# Patient Record
Sex: Male | Born: 1995 | Race: Black or African American | Hispanic: No | Marital: Single | Smoking: Current some day smoker
Health system: Southern US, Community
[De-identification: ages and names within clinical notes are randomized; demographics above are authoritative.]

## PROBLEM LIST (undated history)

## (undated) DIAGNOSIS — K859 Acute pancreatitis without necrosis or infection, unspecified: Secondary | ICD-10-CM

## (undated) DIAGNOSIS — F319 Bipolar disorder, unspecified: Secondary | ICD-10-CM

## (undated) DIAGNOSIS — E109 Type 1 diabetes mellitus without complications: Secondary | ICD-10-CM

## (undated) DIAGNOSIS — F329 Major depressive disorder, single episode, unspecified: Secondary | ICD-10-CM

## (undated) DIAGNOSIS — Q339 Congenital malformation of lung, unspecified: Secondary | ICD-10-CM

## (undated) DIAGNOSIS — J45909 Unspecified asthma, uncomplicated: Secondary | ICD-10-CM

## (undated) DIAGNOSIS — F32A Depression, unspecified: Secondary | ICD-10-CM

## (undated) DIAGNOSIS — E119 Type 2 diabetes mellitus without complications: Secondary | ICD-10-CM

## (undated) DIAGNOSIS — J45998 Other asthma: Secondary | ICD-10-CM

## (undated) HISTORY — DX: Type 2 diabetes mellitus without complications: E11.9

## (undated) HISTORY — DX: Other asthma: J45.998

## (undated) HISTORY — PX: OTHER SURGICAL HISTORY: SHX169

---
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
## 2016-01-08 ENCOUNTER — Encounter (HOSPITAL_COMMUNITY): Payer: Self-pay | Admitting: Emergency Medicine

## 2016-01-08 ENCOUNTER — Emergency Department (HOSPITAL_COMMUNITY)
Admission: EM | Admit: 2016-01-08 | Discharge: 2016-01-08 | Disposition: A | Discharge status: Home / Self Care | Attending: Emergency Medicine | Admitting: Emergency Medicine

## 2016-01-08 DIAGNOSIS — Z794 Long term (current) use of insulin: Secondary | ICD-10-CM | POA: Insufficient documentation

## 2016-01-08 DIAGNOSIS — J45909 Unspecified asthma, uncomplicated: Secondary | ICD-10-CM | POA: Diagnosis not present

## 2016-01-08 DIAGNOSIS — E1065 Type 1 diabetes mellitus with hyperglycemia: Secondary | ICD-10-CM | POA: Insufficient documentation

## 2016-01-08 DIAGNOSIS — F319 Bipolar disorder, unspecified: Secondary | ICD-10-CM | POA: Diagnosis not present

## 2016-01-08 DIAGNOSIS — R739 Hyperglycemia, unspecified: Secondary | ICD-10-CM

## 2016-01-08 DIAGNOSIS — F17210 Nicotine dependence, cigarettes, uncomplicated: Secondary | ICD-10-CM | POA: Insufficient documentation

## 2016-01-08 LAB — CBC WITH DIFFERENTIAL/PLATELET
Basophils Absolute: 0.1 10*3/uL (ref 0.0–0.1)
Basophils Relative: 2 %
Eosinophils Absolute: 0.1 10*3/uL (ref 0.0–0.7)
Eosinophils Relative: 1 %
HCT: 42 % (ref 39.0–52.0)
Hemoglobin: 14.6 g/dL (ref 13.0–17.0)
Lymphocytes Relative: 35 %
Lymphs Abs: 2.1 10*3/uL (ref 0.7–4.0)
MCH: 27 pg (ref 26.0–34.0)
MCHC: 34.8 g/dL (ref 30.0–36.0)
MCV: 77.6 fL — ABNORMAL LOW (ref 78.0–100.0)
Monocytes Absolute: 0.3 10*3/uL (ref 0.1–1.0)
Monocytes Relative: 6 %
Neutro Abs: 3.4 10*3/uL (ref 1.7–7.7)
Neutrophils Relative %: 56 %
Platelets: 255 10*3/uL (ref 150–400)
RBC: 5.41 MIL/uL (ref 4.22–5.81)
RDW: 13.1 % (ref 11.5–15.5)
WBC: 6 10*3/uL (ref 4.0–10.5)

## 2016-01-08 LAB — BASIC METABOLIC PANEL
Anion gap: 19 — ABNORMAL HIGH (ref 5–15)
BUN: 22 mg/dL — ABNORMAL HIGH (ref 6–20)
CO2: 18 mmol/L — ABNORMAL LOW (ref 22–32)
Calcium: 9.6 mg/dL (ref 8.9–10.3)
Chloride: 96 mmol/L — ABNORMAL LOW (ref 101–111)
Creatinine, Ser: 1.18 mg/dL (ref 0.61–1.24)
GFR calc Af Amer: >60 mL/min (ref >60)
GFR calc non Af Amer: >60 mL/min (ref >60)
Glucose, Bld: 505 mg/dL (ref 65–99)
Potassium: 4.6 mmol/L (ref 3.5–5.1)
Sodium: 133 mmol/L — ABNORMAL LOW (ref 135–145)

## 2016-01-08 LAB — BLOOD GAS, VENOUS
Acid-base deficit: 7.9 mmol/L — ABNORMAL HIGH (ref 0.0–2.0)
Bicarbonate: 18.8 mEq/L — ABNORMAL LOW (ref 20.0–24.0)
O2 Saturation: 80.2 %
Patient temperature: 98.6
TCO2: 17.2 mmol/L (ref 0–100)
pCO2, Ven: 44.4 mmHg — ABNORMAL LOW (ref 45.0–50.0)
pH, Ven: 7.25 (ref 7.250–7.300)
pO2, Ven: 54.8 mmHg — ABNORMAL HIGH (ref 31.0–45.0)

## 2016-01-08 LAB — CBG MONITORING, ED
GLUCOSE-CAPILLARY: 449 mg/dL — AB (ref 65–99)
Glucose-Capillary: 480 mg/dL — ABNORMAL HIGH (ref 65–99)
Glucose-Capillary: 473 mg/dL — ABNORMAL HIGH (ref 65–99)
Glucose-Capillary: 264 mg/dL — ABNORMAL HIGH (ref 65–99)

## 2016-01-08 LAB — I-STAT CHEM 8, ED
BUN: 22 mg/dL — ABNORMAL HIGH (ref 6–20)
CHLORIDE: 100 mmol/L — AB (ref 101–111)
Calcium, Ion: 1.18 mmol/L (ref 1.13–1.30)
Creatinine, Ser: 0.9 mg/dL (ref 0.61–1.24)
Glucose, Bld: 509 mg/dL (ref 65–99)
HEMATOCRIT: 48 % (ref 39.0–52.0)
Hemoglobin: 16.3 g/dL (ref 13.0–17.0)
POTASSIUM: 4.6 mmol/L (ref 3.5–5.1)
SODIUM: 133 mmol/L — AB (ref 135–145)
TCO2: 20 mmol/L (ref 0–100)

## 2016-01-08 MED ORDER — SODIUM CHLORIDE 0.9 % IV BOLUS (SEPSIS)
1000.0000 mL | Freq: Once | INTRAVENOUS | Status: AC
  Administered 2016-01-08: 1000 mL via INTRAVENOUS

## 2016-01-08 MED ORDER — SODIUM CHLORIDE 0.9 % IV SOLN
INTRAVENOUS | Status: DC
  Administered 2016-01-08: 4.1 [IU]/h via INTRAVENOUS
  Filled 2016-01-08: qty 2.5

## 2016-01-08 MED ORDER — SODIUM CHLORIDE 0.9 % IV SOLN
INTRAVENOUS | Status: DC
  Administered 2016-01-08: 09:00:00 via INTRAVENOUS

## 2016-01-08 MED ORDER — INSULIN GLARGINE 100 UNIT/ML ~~LOC~~ SOLN
38.0000 [IU] | Freq: Every day | SUBCUTANEOUS | Status: DC
  Administered 2016-01-08: 38 [IU] via SUBCUTANEOUS
  Filled 2016-01-08: qty 0.38

## 2016-01-08 NOTE — ED Notes (Signed)
CBG 473

## 2016-01-08 NOTE — Progress Notes (Addendum)
ED CM consulted for medication assistance for this pt ED CM has seen this pt before Refer to CM notes for this pt in June 2017 Pt was given resources at that time to assist him CM spoke with pt previous legal guardian during June 2017 and pt refuses to no longer use his familial support system and is now possibly homeless per legal guardian During pt last admission Unit Cm assisted pt to get an appt with [facility] at [phone], scheduled a f/u appt for Friday 12/26/15. On December 19 2015 BH d/c pt was informed to  Follow-up Information   Follow [facility] with [facility].  Why: Walk-in clinic Monday-Friday at 8am for assessment for therapy and medication management services. Please bring insurance card and ID.   Contact information:  [facility] [address] [place], [place] [address] Phone: [phone] Fax: [phone]    Follow up with [facility].  Why: CSW made a referral for follow-up appointments on 6/12. CSW will call you on 6/13 with appointment dates/times.  Contact information:  [address] [place] [address] [phone]  Pt has been noncompliant with follow ups  Pt with [facility] 6 ED visits and 5 admissions   Pt has [employer] insurance, therefore there is no [facility] medication assistance program for this pt   ED CM discussed this with EDP Roselyn BeringJ Knapp

## 2016-01-08 NOTE — Progress Notes (Addendum)
ED CM spoke with pt and his "Girlfriend", "Fiance" ? Cowanda" about medications, pcps, insurance CM discussed Pt [facility] ED visits x 6 and admissions x 5 Cm discussed concern that pt is not managing his DM and non compliance with appts and tx since d/c Initially when CM entered pt room the girlfriend had her headset on and was sitting in the bedside chair rocking to and forth with the blanket over her head CM has been asked by ED housekeeping staff to offer her a box of kleenex because she was crying. CM offered her the kleenex and she thanked CM and said she was ok but covered head again with the blanket  Cm spoke with pt who confirms he is covered by his sister [employer]. Sister is in [employer] and [place] but had been in [place] prior to pt coming to [place] Pt confirms he does not have transportation and did not go to last appt because of this.  States he lives with girlfriend, her baby at his girlfriend's mother home within zip code "[phone]" although [place] [id number]  Pt informed CM that his Lantus costs him "seven hundred dollars" and "two hundred fifty dollars" at [facility] or [facility] on [address]"  CM confirmed that goodrx cost of Lantus and Novolog is below $300 and discussed this with the pt CM provided copies of goodrx cost sheets for novolog and lantus Cm discussed with [facility] that [facility] not have a medication program to assist him with his medications nor the co pays Cm discussed patient assistance programs and discount services  CM encouraged finding a pcp within zip code [phone] or [phone] that he could walk to after getting an appointment  CM discussed and provided written information to assist pt with www.needymeds.org, www.goodrx.com, discounted pharmacies and discussed the importance of pcp vs EDP services for f/u care Reviewed resources for [facility] [place] uninsured accepting pcps like [facility], [facility] [address], [facility], [facility],  local urgent care centers, [facility], [facility], general medical clinics, [facility] services of the [facility], [facility] urgent care plus others, medication resources, [facility] out patient pharmacies and housing Pt voiced understanding.  The girlfriend became active in the conversation at this point and informed CM pt had taken last of insulin on "yesterday" and had not been eating or drinking "right" She informed pt to listen to CM and stated that she had encouraged the pt to get a job and to get new insurance States she had attempted to help pt by going to tricare website and getting him to "sign up for medicaid"  She states she likes the idea of getting a pcp near them they could walk to since this is their primary means of transportation since the mother's car is broken Cm answered questions and discussed in network and out of network tricare services and cost of going out of network CM reviewed the resource with her and encouraged her to encourage the pt to find a pcp, take his medications She stated she would encouraged pt but had issues of her own related to taking care of her child, and getting new SSN, birth certificates for her and child and attempting to find housing   ED Cm used teach back method to check if pt understood the interventions he needs to complete Pt was able to discuss the need to get a pcp, check tricare, check goodrx, check needymeds and work on a job and transportation

## 2016-01-08 NOTE — ED Notes (Signed)
Pt given bus pass ?

## 2016-01-08 NOTE — ED Notes (Signed)
Per EMS, pt felt weak and nauseous, hx of DM I, seen frequently for hyperglycemia. Patient told EMS his co-pays for insulin are too high and that he can't afford it.   18 G LFA started by EMS, bolus given d/t CBG reading "High"

## 2016-01-08 NOTE — ED Notes (Signed)
Bed: WA10 Expected date:  Expected time:  Means of arrival:  Comments: EMS-hyperglycemia 

## 2016-01-08 NOTE — ED Notes (Signed)
Pt refuses protocol O2 via [place]

## 2016-01-08 NOTE — Discharge Instructions (Signed)
Hyperglycemia °Hyperglycemia occurs when the glucose (sugar) in your blood is too high. Hyperglycemia can happen for many reasons, but it most often happens to people who do not know they have diabetes or are not managing their diabetes properly.  °CAUSES  °Whether you have diabetes or not, there are other causes of hyperglycemia. Hyperglycemia can occur when you have diabetes, but it can also occur in other situations that you might not be as aware of, such as: °Diabetes °· If you have diabetes and are having problems controlling your blood glucose, hyperglycemia could occur because of some of the following reasons: °¨ Not following your meal plan. °¨ Not taking your diabetes medications or not taking it properly. °¨ Exercising less or doing less activity than you normally do. °¨ Being sick. °Pre-diabetes °· This cannot be ignored. Before people develop Type 2 diabetes, they almost always have "pre-diabetes." This is when your blood glucose levels are higher than normal, but not yet high enough to be diagnosed as diabetes. Research has shown that some long-term damage to the body, especially the heart and circulatory system, may already be occurring during pre-diabetes. If you take action to manage your blood glucose when you have pre-diabetes, you may delay or prevent Type 2 diabetes from developing. °Stress °· If you have diabetes, you may be "diet" controlled or on oral medications or insulin to control your diabetes. However, you may find that your blood glucose is higher than usual in the hospital whether you have diabetes or not. This is often referred to as "stress hyperglycemia." Stress can elevate your blood glucose. This happens because of hormones put out by the body during times of stress. If stress has been the cause of your high blood glucose, it can be followed regularly by your caregiver. That way he/she can make sure your hyperglycemia does not continue to get worse or progress to  diabetes. °Steroids °· Steroids are medications that act on the infection fighting system (immune system) to block inflammation or infection. One side effect can be a rise in blood glucose. Most people can produce enough extra insulin to allow for this rise, but for those who cannot, steroids make blood glucose levels go even higher. It is not unusual for steroid treatments to "uncover" diabetes that is developing. It is not always possible to determine if the hyperglycemia will go away after the steroids are stopped. A special blood test called an A1c is sometimes done to determine if your blood glucose was elevated before the steroids were started. °SYMPTOMS °· Thirsty. °· Frequent urination. °· Dry mouth. °· Blurred vision. °· Tired or fatigue. °· Weakness. °· Sleepy. °· Tingling in feet or leg. °DIAGNOSIS  °Diagnosis is made by monitoring blood glucose in one or all of the following ways: °· A1c test. This is a chemical found in your blood. °· Fingerstick blood glucose monitoring. °· Laboratory results. °TREATMENT  °First, knowing the cause of the hyperglycemia is important before the hyperglycemia can be treated. Treatment may include, but is not be limited to: °· Education. °· Change or adjustment in medications. °· Change or adjustment in meal plan. °· Treatment for an illness, infection, etc. °· More frequent blood glucose monitoring. °· Change in exercise plan. °· Decreasing or stopping steroids. °· Lifestyle changes. °HOME CARE INSTRUCTIONS  °· Test your blood glucose as directed. °· Exercise regularly. Your caregiver will give you instructions about exercise. Pre-diabetes or diabetes which comes on with stress is helped by exercising. °· Eat wholesome,   balanced meals. Eat often and at regular, fixed times. Your caregiver or nutritionist will give you a meal plan to guide your sugar intake. °· Being at an ideal weight is important. If needed, losing as little as 10 to 15 pounds may help improve blood  glucose levels. °SEEK MEDICAL CARE IF:  °· You have questions about medicine, activity, or diet. °· You continue to have symptoms (problems such as increased thirst, urination, or weight gain). °SEEK IMMEDIATE MEDICAL CARE IF:  °· You are vomiting or have diarrhea. °· Your breath smells fruity. °· You are breathing faster or slower. °· You are very sleepy or incoherent. °· You have numbness, tingling, or pain in your feet or hands. °· You have chest pain. °· Your symptoms get worse even though you have been following your caregiver's orders. °· If you have any other questions or concerns. °  °This information is not intended to replace advice given to you by your health care provider. Make sure you discuss any questions you have with your health care provider. °  °Document Released: 12/15/2000 Document Revised: 09/13/2011 Document Reviewed: 02/25/2015 °Elsevier Interactive Patient Education ©2016 [employer]. ° °

## 2016-01-08 NOTE — ED Provider Notes (Signed)
CSN: [id number]     Arrival date & time 01/08/16  0808 History   First MD Initiated Contact with Patient 01/08/16 [phone]     Chief Complaint  Patient presents with  . Hyperglycemia   HPI Patient presents to the emergency room with complaints of nausea vomiting and hyperglycemia. Has a history of diabetes. He is supposed to be on insulin. Patient has history of frequent episodes of hyperglycemia and visits to the emergency room.  The patient was recently in the hospital the end of June for similar episode.  Patient states he ran out of his insulin 2 days ago. He can't afford the co-pays which are too high. He started having trouble with his blood sugar elevating. He had one episode of nausea and vomiting today. Has general malaise. He called EMS and they recorded his CBG reading as high. He denies any trouble with fevers. No chest pain. No abdominal pain. Past Medical History  Diagnosis Date  . Pancreatitis 2013    Hospitalized in [facility] in [place], [place]  . Congenital anomaly of lung   . Asthma     no problems in 2 year  . Type I diabetes mellitus (HCC)     diagnosed at age 64-10 years  . Depression   . Depression   . Bipolar 1 disorder [facility])    Past Surgical History  Procedure Laterality Date  . Left lobectomy     Family History  Problem Relation Age of Onset  . Fibromyalgia Mother   . Mental illness Mother   . Diabetes Father     Type II, no insulin dependent.  . Birth defects Sister     genetic protein deficiency  . Heart murmur Brother    Social History  Substance Use Topics  . Smoking status: Light Tobacco Smoker -- 0.50 packs/day for 6 years    Types: Cigarettes  . Smokeless tobacco: Never Used     Comment: No smokers in the home.  . Alcohol Use: No     Comment: Patient admitted to alcohol use when at home with mother in [place]. "6 drinks weekly"    Review of Systems  All other systems reviewed and are negative.     Allergies   Benadryl; Cephalosporins; and Gantrisin  Home Medications   Prior to Admission medications   Medication Sig Start Date End Date Taking? Authorizing Provider  blood glucose meter kit and supplies KIT Dispense based on patient and insurance preference. Use up to four times daily as directed. (FOR ICD-9 250.00, 250.01). 12/24/15  Yes Zannie Cove, MD  insulin aspart (NOVOLOG) 100 UNIT/ML injection Inject 0-100 Units into the skin 3 (three) times daily with meals. Sliding scale 12/24/15  Yes Zannie Cove, MD  insulin glargine (LANTUS) 100 UNIT/ML injection Inject 0.38 mLs (38 Units total) into the skin daily. 12/24/15  Yes Zannie Cove, MD  potassium chloride SA (K-DUR,KLOR-CON) 20 MEQ tablet Take 1 tablet (20 mEq total) by mouth daily. Patient not taking: Reported on 12/23/2015 12/18/15   Joni Reining Pisciotta, PA-C   BP 133/79 mmHg  Pulse 126  Temp(Src) 98 F (36.7 C) (Oral)  Resp 16  SpO2 98% Physical Exam  Constitutional: He appears well-developed and well-nourished. No distress.  HENT:  Head: Normocephalic and atraumatic.  Right Ear: External ear normal.  Left Ear: External ear normal.  Eyes: Conjunctivae are normal. Right eye exhibits no discharge. Left eye exhibits no discharge. No scleral icterus.  Neck: Neck supple. No tracheal deviation  present.  Cardiovascular: Normal rate, regular rhythm and intact distal pulses.   Pulmonary/Chest: Effort normal and breath sounds normal. No stridor. No respiratory distress. He has no wheezes. He has no rales.  Abdominal: Soft. Bowel sounds are normal. He exhibits no distension. There is no tenderness. There is no rebound and no guarding.  Musculoskeletal: He exhibits no edema or tenderness.  Neurological: He is alert. He has normal strength. No cranial nerve deficit (no facial droop, extraocular movements intact, no slurred speech) or sensory deficit. He exhibits normal muscle tone. He displays no seizure activity. Coordination normal.  Skin:  Skin is warm and dry. No rash noted.  Psychiatric: He has a normal mood and affect.  Nursing note and vitals reviewed.   ED Course  Procedures (including critical care time) Labs Review Labs Reviewed  BLOOD GAS, VENOUS - Abnormal; Notable for the following:    pCO2, Ven 44.4 (*)    pO2, Ven 54.8 (*)    Bicarbonate 18.8 (*)    Acid-base deficit 7.9 (*)    All other components within normal limits  CBC WITH DIFFERENTIAL/PLATELET - Abnormal; Notable for the following:    MCV 77.6 (*)    All other components within normal limits  BASIC METABOLIC PANEL - Abnormal; Notable for the following:    Sodium 133 (*)    Chloride 96 (*)    CO2 18 (*)    Glucose, Bld 505 (*)    BUN 22 (*)    Anion gap 19 (*)    All other components within normal limits  CBG MONITORING, ED - Abnormal; Notable for the following:    Glucose-Capillary 480 (*)    All other components within normal limits  I-STAT CHEM 8, ED - Abnormal; Notable for the following:    Sodium 133 (*)    Chloride 100 (*)    BUN 22 (*)    Glucose, Bld 509 (*)    All other components within normal limits  CBG MONITORING, ED - Abnormal; Notable for the following:    Glucose-Capillary 473 (*)    All other components within normal limits  CBG MONITORING, ED - Abnormal; Notable for the following:    Glucose-Capillary 449 (*)    All other components within normal limits  CBG MONITORING, ED - Abnormal; Notable for the following:    Glucose-Capillary 264 (*)    All other components within normal limits     MDM   Final diagnoses:  Hyperglycemia   Blood sugar decreased with treatment.  No DKA.  At 1400 pt is feeling better and ready to go.  I reviewed notes from the patient's recent hospitalization. Patient told the diabetes care manager when he was in the hospital that he had no trouble affording his medications he just needed prescriptions. Today the patient states that his co-pays were 100s of dollars.  Discussed with case  manager, Maudie Mercury.  She had a long discussion with the patient about resources for him, getting his medications etc.  Pt states he has his rx.  I will give a printout of the Lantus coupon savings program.     Dorie Rank, MD 01/08/16 1406

## 2016-02-03 ENCOUNTER — Emergency Department (HOSPITAL_COMMUNITY)
Admission: EM | Admit: 2016-02-03 | Discharge: 2016-02-04 | Disposition: A | Discharge status: Home / Self Care | Payer: No Typology Code available for payment source | Attending: Dermatology | Admitting: Dermatology

## 2016-02-03 ENCOUNTER — Encounter (HOSPITAL_COMMUNITY): Payer: Self-pay | Admitting: Emergency Medicine

## 2016-02-03 DIAGNOSIS — R51 Headache: Secondary | ICD-10-CM | POA: Insufficient documentation

## 2016-02-03 DIAGNOSIS — Y999 Unspecified external cause status: Secondary | ICD-10-CM | POA: Insufficient documentation

## 2016-02-03 DIAGNOSIS — Y9389 Activity, other specified: Secondary | ICD-10-CM | POA: Insufficient documentation

## 2016-02-03 DIAGNOSIS — Y92410 Unspecified street and highway as the place of occurrence of the external cause: Secondary | ICD-10-CM | POA: Insufficient documentation

## 2016-02-03 DIAGNOSIS — Z5321 Procedure and treatment not carried out due to patient leaving prior to being seen by health care provider: Secondary | ICD-10-CM | POA: Insufficient documentation
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
## 2016-02-03 NOTE — ED Notes (Signed)
Pt named called, no answer 

## 2016-04-12 ENCOUNTER — Inpatient Hospital Stay (HOSPITAL_COMMUNITY)
Admission: EM | Admit: 2016-04-12 | Discharge: 2016-04-14 | DRG: 639 | Disposition: A | Discharge status: Home / Self Care | Payer: Medicaid Other | Attending: Family Medicine | Admitting: Family Medicine

## 2016-04-12 ENCOUNTER — Encounter (HOSPITAL_COMMUNITY): Payer: Self-pay | Admitting: Emergency Medicine

## 2016-04-12 DIAGNOSIS — E109 Type 1 diabetes mellitus without complications: Secondary | ICD-10-CM | POA: Diagnosis present

## 2016-04-12 DIAGNOSIS — F17210 Nicotine dependence, cigarettes, uncomplicated: Secondary | ICD-10-CM | POA: Diagnosis present

## 2016-04-12 DIAGNOSIS — F319 Bipolar disorder, unspecified: Secondary | ICD-10-CM | POA: Diagnosis present

## 2016-04-12 DIAGNOSIS — E1010 Type 1 diabetes mellitus with ketoacidosis without coma: Principal | ICD-10-CM | POA: Diagnosis present

## 2016-04-12 DIAGNOSIS — E1110 Type 2 diabetes mellitus with ketoacidosis without coma: Secondary | ICD-10-CM | POA: Diagnosis present

## 2016-04-12 DIAGNOSIS — Z882 Allergy status to sulfonamides status: Secondary | ICD-10-CM

## 2016-04-12 DIAGNOSIS — E875 Hyperkalemia: Secondary | ICD-10-CM | POA: Diagnosis present

## 2016-04-12 DIAGNOSIS — Z888 Allergy status to other drugs, medicaments and biological substances status: Secondary | ICD-10-CM

## 2016-04-12 DIAGNOSIS — Z794 Long term (current) use of insulin: Secondary | ICD-10-CM

## 2016-04-12 DIAGNOSIS — Z818 Family history of other mental and behavioral disorders: Secondary | ICD-10-CM

## 2016-04-12 DIAGNOSIS — E1065 Type 1 diabetes mellitus with hyperglycemia: Secondary | ICD-10-CM

## 2016-04-12 DIAGNOSIS — Z833 Family history of diabetes mellitus: Secondary | ICD-10-CM

## 2016-04-12 DIAGNOSIS — Z590 Homelessness: Secondary | ICD-10-CM

## 2016-04-12 LAB — URINALYSIS, ROUTINE W REFLEX MICROSCOPIC
Bilirubin Urine: NEGATIVE
Glucose, UA: >1000 mg/dL — AB
Hgb urine dipstick: NEGATIVE
Ketones, ur: >80 mg/dL — AB
Leukocytes, UA: NEGATIVE
Nitrite: NEGATIVE
Protein, ur: NEGATIVE mg/dL
Specific Gravity, Urine: 1.034 — ABNORMAL HIGH (ref 1.005–1.030)
pH: 5.5 (ref 5.0–8.0)

## 2016-04-12 LAB — GLUCOSE, CAPILLARY
GLUCOSE-CAPILLARY: 164 mg/dL — AB (ref 65–99)
GLUCOSE-CAPILLARY: 94 mg/dL (ref 65–99)
GLUCOSE-CAPILLARY: 117 mg/dL — AB (ref 65–99)
Glucose-Capillary: 284 mg/dL — ABNORMAL HIGH (ref 65–99)
Glucose-Capillary: 182 mg/dL — ABNORMAL HIGH (ref 65–99)
Glucose-Capillary: 148 mg/dL — ABNORMAL HIGH (ref 65–99)
Glucose-Capillary: 110 mg/dL — ABNORMAL HIGH (ref 65–99)

## 2016-04-12 LAB — BASIC METABOLIC PANEL WITH GFR
Anion gap: 18 — ABNORMAL HIGH (ref 5–15)
BUN: 14 mg/dL (ref 6–20)
CO2: 13 mmol/L — ABNORMAL LOW (ref 22–32)
Calcium: 9.2 mg/dL (ref 8.9–10.3)
Chloride: 99 mmol/L — ABNORMAL LOW (ref 101–111)
Creatinine, Ser: 1.18 mg/dL (ref 0.61–1.24)
GFR calc Af Amer: >60 mL/min
GFR calc non Af Amer: >60 mL/min
Glucose, Bld: 531 mg/dL (ref 65–99)
Potassium: 5.2 mmol/L — ABNORMAL HIGH (ref 3.5–5.1)
Sodium: 130 mmol/L — ABNORMAL LOW (ref 135–145)

## 2016-04-12 LAB — BASIC METABOLIC PANEL
Anion gap: 15 (ref 5–15)
Anion gap: 9 (ref 5–15)
BUN: 15 mg/dL (ref 6–20)
BUN: 11 mg/dL (ref 6–20)
CHLORIDE: 104 mmol/L (ref 101–111)
CHLORIDE: 107 mmol/L (ref 101–111)
CO2: 14 mmol/L — AB (ref 22–32)
CO2: 21 mmol/L — AB (ref 22–32)
CREATININE: 1.15 mg/dL (ref 0.61–1.24)
CREATININE: 0.94 mg/dL (ref 0.61–1.24)
Calcium: 9.3 mg/dL (ref 8.9–10.3)
Calcium: 9.3 mg/dL (ref 8.9–10.3)
GFR calc Af Amer: >60 mL/min (ref >60)
GFR calc Af Amer: >60 mL/min (ref >60)
GFR calc non Af Amer: >60 mL/min (ref >60)
GFR calc non Af Amer: >60 mL/min (ref >60)
GLUCOSE: 315 mg/dL — AB (ref 65–99)
GLUCOSE: 112 mg/dL — AB (ref 65–99)
POTASSIUM: 4.6 mmol/L (ref 3.5–5.1)
POTASSIUM: 3.9 mmol/L (ref 3.5–5.1)
SODIUM: 137 mmol/L (ref 135–145)
Sodium: 133 mmol/L — ABNORMAL LOW (ref 135–145)

## 2016-04-12 LAB — CBC WITH DIFFERENTIAL/PLATELET
Basophils Absolute: 0.1 K/uL (ref 0.0–0.1)
Basophils Relative: 1 %
Eosinophils Absolute: 0.1 K/uL (ref 0.0–0.7)
Eosinophils Relative: 1 %
HCT: 39.1 % (ref 39.0–52.0)
Hemoglobin: 13.2 g/dL (ref 13.0–17.0)
Lymphocytes Relative: 21 %
Lymphs Abs: 1.5 K/uL (ref 0.7–4.0)
MCH: 25.6 pg — ABNORMAL LOW (ref 26.0–34.0)
MCHC: 33.8 g/dL (ref 30.0–36.0)
MCV: 75.9 fL — ABNORMAL LOW (ref 78.0–100.0)
Monocytes Absolute: 0.4 K/uL (ref 0.1–1.0)
Monocytes Relative: 5 %
Neutro Abs: 5.1 K/uL (ref 1.7–7.7)
Neutrophils Relative %: 72 %
Platelets: 244 K/uL (ref 150–400)
RBC: 5.15 MIL/uL (ref 4.22–5.81)
RDW: 12.7 % (ref 11.5–15.5)
WBC: 7.1 K/uL (ref 4.0–10.5)

## 2016-04-12 LAB — CBC
HEMATOCRIT: 41.5 % (ref 39.0–52.0)
Hemoglobin: 13.7 g/dL (ref 13.0–17.0)
MCH: 25.8 pg — AB (ref 26.0–34.0)
MCHC: 33 g/dL (ref 30.0–36.0)
MCV: 78.3 fL (ref 78.0–100.0)
Platelets: 217 10*3/uL (ref 150–400)
RBC: 5.3 MIL/uL (ref 4.22–5.81)
RDW: 12.6 % (ref 11.5–15.5)
WBC: 9.3 10*3/uL (ref 4.0–10.5)

## 2016-04-12 LAB — URINE MICROSCOPIC-ADD ON

## 2016-04-12 LAB — CBG MONITORING, ED
GLUCOSE-CAPILLARY: 560 mg/dL — AB (ref 65–99)
Glucose-Capillary: 463 mg/dL — ABNORMAL HIGH (ref 65–99)
Glucose-Capillary: 338 mg/dL — ABNORMAL HIGH (ref 65–99)

## 2016-04-12 LAB — PHOSPHORUS: Phosphorus: 4.3 mg/dL (ref 2.5–4.6)

## 2016-04-12 LAB — MRSA PCR SCREENING: MRSA by PCR: NEGATIVE

## 2016-04-12 LAB — MAGNESIUM: Magnesium: 2 mg/dL (ref 1.7–2.4)

## 2016-04-12 MED ORDER — INSULIN ASPART 100 UNIT/ML ~~LOC~~ SOLN
0.0000 [IU] | Freq: Every day | SUBCUTANEOUS | Status: DC
  Administered 2016-04-13: 4 [IU] via SUBCUTANEOUS

## 2016-04-12 MED ORDER — INSULIN GLARGINE 100 UNIT/ML ~~LOC~~ SOLN: 28.0000 [IU] | Freq: Every day | SUBCUTANEOUS | Status: DC

## 2016-04-12 MED ORDER — DEXTROSE-NACL 5-0.45 % IV SOLN
INTRAVENOUS | Status: DC
  Administered 2016-04-12: 18:00:00 via INTRAVENOUS

## 2016-04-12 MED ORDER — SODIUM CHLORIDE 0.9 % IV SOLN
INTRAVENOUS | Status: AC
  Administered 2016-04-13: 08:00:00 via INTRAVENOUS

## 2016-04-12 MED ORDER — DEXTROSE-NACL 5-0.45 % IV SOLN: INTRAVENOUS | Status: DC

## 2016-04-12 MED ORDER — SODIUM CHLORIDE 0.9 % IV SOLN
INTRAVENOUS | Status: DC
  Administered 2016-04-12: 15:00:00 via INTRAVENOUS
  Filled 2016-04-12: qty 2.5

## 2016-04-12 MED ORDER — DEXTROSE 50 % IV SOLN: 25.0000 mL | INTRAVENOUS | Status: DC | PRN

## 2016-04-12 MED ORDER — SODIUM CHLORIDE 0.9 % IV SOLN
INTRAVENOUS | Status: DC
  Administered 2016-04-12: 15:00:00 via INTRAVENOUS

## 2016-04-12 MED ORDER — SODIUM CHLORIDE 0.9 % IV SOLN
INTRAVENOUS | Status: DC
  Filled 2016-04-12: qty 2.5

## 2016-04-12 MED ORDER — ENOXAPARIN SODIUM 40 MG/0.4ML ~~LOC~~ SOLN: 40.0000 mg | SUBCUTANEOUS | Status: DC

## 2016-04-12 MED ORDER — SODIUM CHLORIDE 0.9 % IV BOLUS (SEPSIS)
2000.0000 mL | Freq: Once | INTRAVENOUS | Status: AC
  Administered 2016-04-12: 2000 mL via INTRAVENOUS

## 2016-04-12 MED ORDER — INSULIN ASPART 100 UNIT/ML ~~LOC~~ SOLN: 0.0000 [IU] | Freq: Three times a day (TID) | SUBCUTANEOUS | Status: DC

## 2016-04-12 MED ORDER — INSULIN GLARGINE 100 UNIT/ML ~~LOC~~ SOLN
38.0000 [IU] | Freq: Every day | SUBCUTANEOUS | Status: DC
  Administered 2016-04-12 – 2016-04-13 (×2): 38 [IU] via SUBCUTANEOUS
  Filled 2016-04-12 (×2): qty 0.38
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
## 2016-04-12 NOTE — Progress Notes (Signed)
Pt confirms [address] [place] [id number] [place]  Has a fiance is with her cousin now They were living with girlfriend/fiance  court date November 13

## 2016-04-12 NOTE — Progress Notes (Signed)
Pt given a list of [place], an Psychologist, sport and exerciseuninsured list of resources, a Horticulturist, commerciallist of shelters, a list of pantries and [place] rental assistance information. Pt states he spoke with his father "yesterday" and "prince" another family member "four days ago" who is encouraging him to "clear my name here and go [place]"   Pt given a warm blanket
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
## 2016-04-12 NOTE — ED Notes (Signed)
Bed: [id number] Expected date:  Expected time:  Means of arrival:  Comments: EMS, 20 yo n/v,  hyperglycemia

## 2016-04-12 NOTE — ED Notes (Signed)
Glucose 531

## 2016-04-13 DIAGNOSIS — R112 Nausea with vomiting, unspecified: Secondary | ICD-10-CM | POA: Diagnosis not present

## 2016-04-13 DIAGNOSIS — Z818 Family history of other mental and behavioral disorders: Secondary | ICD-10-CM | POA: Diagnosis not present

## 2016-04-13 DIAGNOSIS — E875 Hyperkalemia: Secondary | ICD-10-CM | POA: Diagnosis not present

## 2016-04-13 DIAGNOSIS — Z882 Allergy status to sulfonamides status: Secondary | ICD-10-CM | POA: Diagnosis not present

## 2016-04-13 DIAGNOSIS — F319 Bipolar disorder, unspecified: Secondary | ICD-10-CM | POA: Diagnosis present

## 2016-04-13 DIAGNOSIS — Z794 Long term (current) use of insulin: Secondary | ICD-10-CM | POA: Diagnosis not present

## 2016-04-13 DIAGNOSIS — Z888 Allergy status to other drugs, medicaments and biological substances status: Secondary | ICD-10-CM | POA: Diagnosis not present

## 2016-04-13 DIAGNOSIS — E1065 Type 1 diabetes mellitus with hyperglycemia: Secondary | ICD-10-CM | POA: Diagnosis not present

## 2016-04-13 DIAGNOSIS — E1010 Type 1 diabetes mellitus with ketoacidosis without coma: Secondary | ICD-10-CM | POA: Diagnosis not present

## 2016-04-13 DIAGNOSIS — F17210 Nicotine dependence, cigarettes, uncomplicated: Secondary | ICD-10-CM | POA: Diagnosis present

## 2016-04-13 DIAGNOSIS — Z833 Family history of diabetes mellitus: Secondary | ICD-10-CM | POA: Diagnosis not present

## 2016-04-13 DIAGNOSIS — Z590 Homelessness: Secondary | ICD-10-CM | POA: Diagnosis not present

## 2016-04-13 LAB — BASIC METABOLIC PANEL
Anion gap: 9 (ref 5–15)
BUN: 11 mg/dL (ref 6–20)
CHLORIDE: 107 mmol/L (ref 101–111)
CO2: 19 mmol/L — ABNORMAL LOW (ref 22–32)
CREATININE: 0.83 mg/dL (ref 0.61–1.24)
Calcium: 8.6 mg/dL — ABNORMAL LOW (ref 8.9–10.3)
Glucose, Bld: 281 mg/dL — ABNORMAL HIGH (ref 65–99)
POTASSIUM: 4 mmol/L (ref 3.5–5.1)
SODIUM: 135 mmol/L (ref 135–145)

## 2016-04-13 LAB — GLUCOSE, CAPILLARY
GLUCOSE-CAPILLARY: 101 mg/dL — AB (ref 65–99)
GLUCOSE-CAPILLARY: 184 mg/dL — AB (ref 65–99)
GLUCOSE-CAPILLARY: 202 mg/dL — AB (ref 65–99)
GLUCOSE-CAPILLARY: 306 mg/dL — AB (ref 65–99)
Glucose-Capillary: 92 mg/dL (ref 65–99)

## 2016-04-13 MED ORDER — INSULIN ASPART 100 UNIT/ML ~~LOC~~ SOLN
0.0000 [IU] | Freq: Three times a day (TID) | SUBCUTANEOUS | Status: DC
  Administered 2016-04-13: 2 [IU] via SUBCUTANEOUS

## 2016-04-13 MED ORDER — INSULIN ASPART 100 UNIT/ML ~~LOC~~ SOLN
0.0000 [IU] | Freq: Three times a day (TID) | SUBCUTANEOUS | Status: DC
  Administered 2016-04-13: 7 [IU] via SUBCUTANEOUS

## 2016-04-13 MED ORDER — INSULIN ASPART 100 UNIT/ML ~~LOC~~ SOLN
8.0000 [IU] | Freq: Three times a day (TID) | SUBCUTANEOUS | Status: DC
  Administered 2016-04-13: 8 [IU] via SUBCUTANEOUS
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
## 2016-04-13 NOTE — Progress Notes (Signed)
Nutrition Brief Note  Patient identified on the Malnutrition Screening Tool (MST) Report  Wt Readings from Last 15 Encounters:  04/12/16 145 lb (65.8 kg) (35 %, Z= -0.38)*  12/25/15 140 lb (63.5 kg) (28 %, Z= -0.57)*  12/18/15 138 lb (62.6 kg) (25 %, Z= -0.67)*  12/08/15 132 lb (59.9 kg) (16 %, Z= -0.98)*  12/07/15 140 lb (63.5 kg) (29 %, Z= -0.56)*  12/05/15 131 lb 8 oz (59.6 kg) (16 %, Z= -1.00)*  10/29/15 125 lb 10.6 oz (57 kg) (9 %, Z= -1.32)*  10/13/15 134 lb 11.2 oz (61.1 kg) (21 %, Z= -0.81)*  12/31/12 129 lb (58.5 kg) (38 %, Z= -0.29)*  07/28/12 141 lb 1.5 oz (64 kg) (65 %, Z= 0.39)*  07/24/12 123 lb 7.3 oz (56 kg) (36 %, Z= -0.36)*  06/19/12 112 lb 3.4 oz (50.9 kg) (18 %, Z= -0.90)*   * Growth percentiles are based on CDC 2-20 Years data.    Body mass index is 21.41 kg/m. Patient meets criteria for normal weight based on current BMI.   Skin WDL. Current diet order is Carb Modified. No intakes documented since admission; pt recently woke up and states he has ordered breakfast and is waiting for it to arrive. Pt reports that symptoms of N/V, weakness, dizziness, and polydipsia began yesterday ~0900 and that he came to the hospital 1-2 hours later and was subsequently admitted. Pt reports that prior to this incident he was controlling CBGs well since previous ED visit and that he felt that at times his CBG may have even been low but no highs. Pt denies any recent issues with eating or with vast changes in CBG after eating.   Labs reviewed; CBGs: 202 and 306 mg/dL this AM.  Medications reviewed; sliding scale Novolog and 38 units of Lantus at bedtime. IVF: NS @ 125 mL/hr.   No nutrition interventions warranted at this time. If nutrition issues arise, please consult RD.    Trenton GammonJessica Diamone Whistler, MS, RD, LDN Inpatient Clinical Dietitian Pager # [phone] After hours/weekend pager # [phone]

## 2016-04-13 NOTE — Progress Notes (Signed)
Inpatient Diabetes Program Recommendations  AACE/ADA: New Consensus Statement on Inpatient Glycemic Control (2015)  Target Ranges:  Prepandial:   less than 140 mg/dL      Peak postprandial:   less than 180 mg/dL (1-2 hours)      Critically ill patients:  140 - 180 mg/dL   Lab Results  Component Value Date   GLUCAP 202 (H) 04/13/2016   HGBA1C 14.0 (H) 12/18/2015    Review of Glycemic Control  Diabetes history: DM1 Outpatient Diabetes medications: Lantus 38 units QD, Novolog 14 units tidwc Current orders for Inpatient glycemic control: Lantus 38 units QHS, novolog resistant tidwc and hs Familiar with pt from previous admission on 12/24/2015 Inpatient Diabetes Program Recommendations:    Decrease Novolog to sensitive tidwc and hs since pt is Type 1. Add Novolog 8 units tidwc for meal coverage insulin (on 14 units tidwc at home) Need updated HgbA1C to assess glycemic control prior to admission.  To speak with pt this morning regarding his diabetes control.  Will follow.  Thank you. Ailene Ardshonda Vittoria Noreen, RD, LDN, CDE Inpatient Diabetes Coordinator [phone]

## 2016-04-13 NOTE — Progress Notes (Signed)
Inpatient Diabetes Program Recommendations  AACE/ADA: New Consensus Statement on Inpatient Glycemic Control (2015)  Target Ranges:  Prepandial:   less than 140 mg/dL      Peak postprandial:   less than 180 mg/dL (1-2 hours)      Critically ill patients:  140 - 180 mg/dL   Lab Results  Component Value Date   GLUCAP 101 (H) 04/13/2016   HGBA1C 14.0 (H) 12/18/2015   Familiar with pt from previous admission. Spoke with pt about his glucose control at home. Pt states he broke his Freesyle glucose meter and would like a new one. Has not been checking blood sugars. States he has no problems in getting his insulin.  Thank you. Ailene Ardshonda Earnstine Meinders, RD, LDN, CDE Inpatient Diabetes Coordinator [phone] you.

## 2016-04-14 LAB — GLUCOSE, CAPILLARY
Glucose-Capillary: 132 mg/dL — ABNORMAL HIGH (ref 65–99)
Glucose-Capillary: 191 mg/dL — ABNORMAL HIGH (ref 65–99)

## 2016-04-14 LAB — HEMOGLOBIN A1C
Hgb A1c MFr Bld: 13.6 % — ABNORMAL HIGH (ref 4.8–5.6)
Mean Plasma Glucose: 344 mg/dL

## 2016-04-14 MED ORDER — FREESTYLE LITE DEVI: 1.0000 | Freq: Once | 0 refills | Status: AC
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
## 2016-04-14 NOTE — Progress Notes (Signed)
Pt  Ordered meal tray and was instructed to notify nurse when tray arrived, so staff could check CBG.  Upon entering room, noticed fully eaten tray at pt bedside. Pt states he didn't eat the food, family at bedside ate.  Checked pt CBG, 191, pt refused insulin because is walking home at discharge in the near future.  Pt reminded to take check CBG, eat, and administer insulin when get home.  Pt verbalized understanding.

## 2016-04-14 NOTE — Progress Notes (Signed)
Pt discharged home.  VSS. Discharge instructions with prescriptions given. Pt again instructed to check CBG, eat, and administer insulin when get home.  Pt verbalized understanding.

## 2017-10-12 IMAGING — DX DG CHEST 1V PORT
1 series · 1 of 1 positions shown · non-contrast
Comparison: None.

CLINICAL DATA: Nausea and weakness, onset at [DATE].

EXAM:
PORTABLE CHEST 1 VIEW

[chest ap]
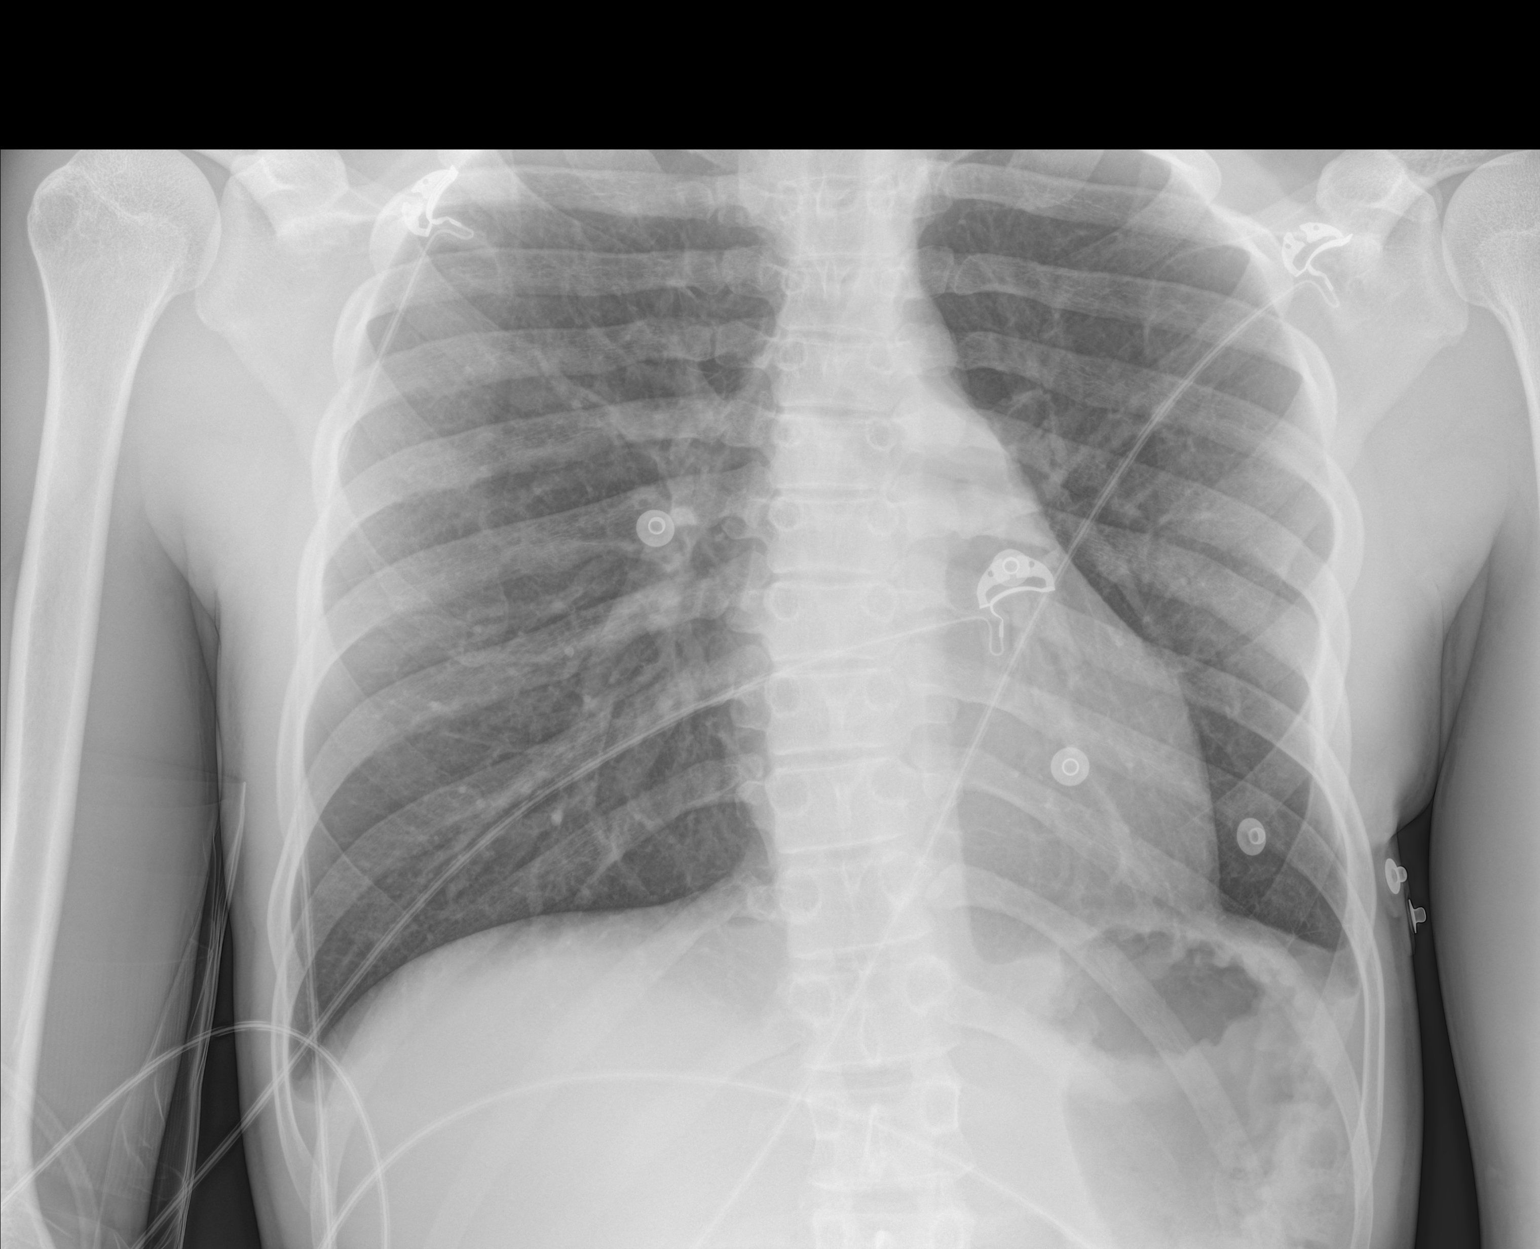

[1 of 1 positions shown; findings below may reference images not displayed]

FINDINGS: The lungs are clear. There is mild chest wall deformity and reduced
volume in the left hemi thorax inferiorly. Hilar, mediastinal and
cardiac contours are unremarkable. There is no pneumothorax. There
is no large effusion. Pulmonary vasculature is normal.
IMPRESSION: No acute cardiopulmonary findings.

## 2018-04-19 ENCOUNTER — Emergency Department
Admission: EM | Admit: 2018-04-19 | Discharge: 2018-04-19 | Disposition: A | Discharge status: Home / Self Care | Payer: Medicaid HMO | Attending: Emergency Medicine | Admitting: Emergency Medicine

## 2018-04-19 DIAGNOSIS — Z760 Encounter for issue of repeat prescription: Secondary | ICD-10-CM | POA: Insufficient documentation

## 2018-04-19 DIAGNOSIS — F17210 Nicotine dependence, cigarettes, uncomplicated: Secondary | ICD-10-CM | POA: Insufficient documentation

## 2018-04-19 DIAGNOSIS — Z794 Long term (current) use of insulin: Secondary | ICD-10-CM | POA: Insufficient documentation

## 2018-04-19 DIAGNOSIS — E1165 Type 2 diabetes mellitus with hyperglycemia: Secondary | ICD-10-CM | POA: Insufficient documentation

## 2018-04-19 DIAGNOSIS — R739 Hyperglycemia, unspecified: Secondary | ICD-10-CM

## 2018-04-19 LAB — GLUCOSE WHOLE BLOOD - POCT: Whole Blood Glucose POCT: 413 mg/dL — ABNORMAL HIGH (ref 70–100)

## 2018-04-19 MED ORDER — ACCU-CHEK FASTCLIX LANCETS MISC
11 refills | Status: AC
Start: 2018-04-19 — End: ?

## 2018-04-19 MED ORDER — INSULIN LISPRO 100 UNIT/ML SC SOLN
SUBCUTANEOUS | 3 refills | Status: DC
Start: 2018-04-19 — End: 2018-07-03

## 2018-04-19 MED ORDER — INSULIN ASPART 100 UNIT/ML SC SOLN
SUBCUTANEOUS | 3 refills | Status: DC
Start: 2018-04-19 — End: 2018-05-05

## 2018-04-19 MED ORDER — INSULIN GLARGINE 100 UNIT/ML SC SOLN
38.00 [IU] | Freq: Every evening | SUBCUTANEOUS | 3 refills | Status: DC
Start: 2018-04-19 — End: 2018-05-05

## 2018-04-19 MED ORDER — GLUCOSE BLOOD VI STRP
ORAL_STRIP | 11 refills | Status: AC
Start: 2018-04-19 — End: 2019-04-19

## 2018-04-19 NOTE — ED Notes (Signed)
MD at bedside. 

## 2018-04-19 NOTE — ED Notes (Signed)
Took BG at home about an hour ago was 328

## 2018-04-19 NOTE — Discharge Instructions (Signed)
1. Return immediately if worse in any way or change your mind and would agree to blood work to rule out Diabetic Ketoacidosis (DKA).    2. Follow up with your primary medical doctor for recheck.  Return to the emergency deparment if unable to follow up as instructed for any reason.    3. If you have any issues with follow up please call the [facility] Case Manager at [phone] between 11am and 5pm Monday-Friday for assistance.

## 2018-04-26 NOTE — ED Provider Notes (Signed)
EMERGENCY DEPARTMENT NOTE    Physician/Midlevel provider first contact with patient: 04/19/18 1416         HISTORY OF PRESENT ILLNESS   Historian:Patient  Translator Used: no    Chief Complaint: Medication Refill     22 y.o. male presents to the emergency department as he is out of his insulin for his insulin-dependent diabetes.  Patient took the last dose this morning.  Patient recently moved to the area and does not have a primary care doctor.  Patient otherwise is feeling well.  Sugars 328 this morning.    1. Location of symptoms: Diffuse  2. Onset of symptoms: Today  3. What was patient doing when symptoms started (Context): see above  4. Severity: moderate  5. Timing: Constant  6. Activities that worsen symptoms: None  7. Activities that improve symptoms: Insulin  8. Quality: No pain  9. Radiation of symptoms: no  10. Associated signs and Symptoms: see above  11. Are symptoms worsening? yes  MEDICAL HISTORY     Past Medical History:  Past Medical History:   Diagnosis Date   . Diabetes mellitus    . Seasonal asthma        Past Surgical History:  History reviewed. No pertinent surgical history.    Social History:  Social History     Socioeconomic History   . Marital status: Single     Spouse name: Not on file   . Number of children: Not on file   . Years of education: Not on file   . Highest education level: Not on file   Occupational History   . Not on file   Social Needs   . Financial resource strain: Not on file   . Food insecurity:     Worry: Not on file     Inability: Not on file   . Transportation needs:     Medical: Not on file     Non-medical: Not on file   Tobacco Use   . Smoking status: Current Every Day Smoker     Packs/day: 0.50   . Smokeless tobacco: Never Used   Substance and Sexual Activity   . Alcohol use: Yes     Comment: socially    . Drug use: Yes     Comment: marijuana a couple times a week    . Sexual activity: Not on file   Lifestyle   . Physical activity:     Days per week: Not on file      Minutes per session: Not on file   . Stress: Not on file   Relationships   . Social connections:     Talks on phone: Not on file     Gets together: Not on file     Attends religious service: Not on file     Active member of club or organization: Not on file     Attends meetings of clubs or organizations: Not on file     Relationship status: Not on file   . Intimate partner violence:     Fear of current or ex partner: Not on file     Emotionally abused: Not on file     Physically abused: Not on file     Forced sexual activity: Not on file   Other Topics Concern   . Not on file   Social History Narrative   . Not on file       Family History:  History reviewed. No pertinent family history.  Outpatient Medication:  Discharge Medication List as of 04/19/2018  2:44 PM            REVIEW OF SYSTEMS   Review of Systems   Eyes: Negative for blurred vision and double vision.   Respiratory: Negative for shortness of breath.    Cardiovascular: Negative for chest pain.   Gastrointestinal: Negative for abdominal pain, diarrhea, nausea and vomiting.   Genitourinary: Negative for flank pain and frequency.   Musculoskeletal: Negative for back pain and neck pain.   All other systems reviewed and are negative.    PHYSICAL EXAM     ED Triage Vitals [04/19/18 1348]   Enc Vitals Group      BP 129/86      Heart Rate 91      Resp Rate 18      Temp 98.3 F (36.8 C)      Temp Source Oral      SpO2 98 %      Weight 61 kg      Height 1.721 m      Head Circumference       Peak Flow       Pain Score 0      Pain Loc       Pain Edu?       Excl. in GC?    Nursing note and vitals reviewed.  Constitutional:  Well developed, well nourished.  Awake & alert  Head:  Atraumatic.  Normocephalic.    ENT:   No stridor. Patent airway.  Neck:  Supple.  No JVD.    Pulmonary/Chest:  No evidence of respiratory distress.   Extremities:  No edema.   No cyanosis.  No clubbing.    Skin:  Skin is warm and dry.  No diaphoresis.  Neurological: Normal speech.  Normal  Gate  Psychiatric:  Good eye contact.  Normal interaction, affect, and behavior    MEDICAL DECISION MAKING     DISCUSSION    Glucose is elevated here I recommend getting a electrolyte panel to rule out DKA but patient refuses and understands risk.  Patient will continue to monitor glucose closely at home.  Patient's insulin was refilled as requested.  Patient given follow-up info for primary care doctor.  Will discharge home follow-up primary care doctor return if worse.  Patient understands and agrees with plan.       Vital Signs: Reviewed the patient?s vital signs.   Nursing Notes: Reviewed and utilized available nursing notes.  Medical Records Reviewed: Reviewed available past medical records.  Counseling: The emergency provider has spoken with the patient and discussed today?s findings, in addition to providing specific details for the plan of care.  Questions are answered and there is agreement with the plan.        RADIOLOGY IMAGING STUDIES      No orders to display         PULSE OXIMETRY    Oxygen Saturation by Pulse Oximetry: 100%  Interventions: none  Interpretation:  normal    EMERGENCY DEPT. MEDICATIONS      ED Medication Orders (From admission, onward)    None          LABORATORY RESULTS    Ordered and independently interpreted AVAILABLE laboratory tests. Please see results section in chart for full details.  Results for orders placed or performed during the hospital encounter of 04/19/18   Glucose Whole Blood - POCT   Result Value Ref Range    POCT - Glucose  Whole blood 413 (H) 70 - 100 mg/dL       CRITICAL CARE/PROCEDURES    Procedures    DIAGNOSIS      Diagnosis:  Final diagnoses:   Hyperglycemia       Disposition:  ED Disposition     ED Disposition Condition Date/Time Comment    Discharge  Wed Apr 19, 2018  2:44 PM Suan Halter. discharge to home/self care.    Condition at disposition: Stable          Prescriptions:  Discharge Medication List as of 04/19/2018  2:44 PM      START taking these  medications    Details   ACCU-CHEK FASTCLIX LANCETS Misc Check blood sugar 7 times daily, as directed by MD., Print      glucose blood (ACCU-CHEK AVIVA PLUS) test strip Check blood sugar 7 times daily, as directed by MD., Print      insulin aspart (NOVOLOG) 100 UNIT/ML injection Administer via carb count protocal PRN, Print         CONTINUE these medications which have CHANGED    Details   insulin glargine (LANTUS) 100 UNIT/ML injection Inject 38 Units into the skin nightly, Starting Wed 04/19/2018, Print      insulin lispro (HUMALOG) 100 UNIT/ML injection Administer via carb count protocal, Print             This note was generated by the Epic EMR system/ Dragon speech recognition and may contain inherent errors or omissions not intended by the user. Grammatical errors, random word insertions, deletions and pronoun errors are occasional consequences of this technology due to software limitations. Not all errors are caught or corrected. If there are questions or concerns about the content of this note or information contained within the body of this dictation they should be addressed directly with the author for clarification.     Shela Nevin, MD  04/26/18 [phone]

## 2018-05-04 ENCOUNTER — Observation Stay
Admission: EM | Admit: 2018-05-04 | Discharge: 2018-05-05 | Disposition: A | Discharge status: Home / Self Care | Payer: Medicaid HMO | Attending: Family Medicine | Admitting: Family Medicine

## 2018-05-04 DIAGNOSIS — Z794 Long term (current) use of insulin: Secondary | ICD-10-CM | POA: Insufficient documentation

## 2018-05-04 DIAGNOSIS — E1110 Type 2 diabetes mellitus with ketoacidosis without coma: Secondary | ICD-10-CM | POA: Diagnosis present

## 2018-05-04 DIAGNOSIS — J45901 Unspecified asthma with (acute) exacerbation: Secondary | ICD-10-CM | POA: Insufficient documentation

## 2018-05-04 DIAGNOSIS — E860 Dehydration: Secondary | ICD-10-CM | POA: Insufficient documentation

## 2018-05-04 DIAGNOSIS — E1010 Type 1 diabetes mellitus with ketoacidosis without coma: Principal | ICD-10-CM | POA: Insufficient documentation

## 2018-05-04 DIAGNOSIS — F17210 Nicotine dependence, cigarettes, uncomplicated: Secondary | ICD-10-CM | POA: Insufficient documentation

## 2018-05-04 DIAGNOSIS — N179 Acute kidney failure, unspecified: Secondary | ICD-10-CM | POA: Insufficient documentation

## 2018-05-04 LAB — COMPREHENSIVE METABOLIC PANEL
ALT: 19 U/L (ref 0–55)
AST (SGOT): 23 U/L (ref 5–34)
Albumin/Globulin Ratio: 1.7 (ref 0.9–2.2)
Albumin: 4.3 g/dL (ref 3.5–5.0)
Alkaline Phosphatase: 192 U/L — ABNORMAL HIGH (ref 38–106)
Anion Gap: 22 — ABNORMAL HIGH (ref 5.0–15.0)
BUN: 23 mg/dL (ref 9–28)
Bilirubin, Total: 0.8 mg/dL (ref 0.2–1.2)
CO2: 16 mEq/L — ABNORMAL LOW (ref 22–29)
Calcium: 10.2 mg/dL (ref 8.5–10.5)
Chloride: 92 mEq/L — ABNORMAL LOW (ref 100–111)
Creatinine: 1.7 mg/dL — ABNORMAL HIGH (ref 0.7–1.3)
Globulin: 2.6 g/dL (ref 2.0–3.6)
Glucose: 723 mg/dL (ref 70–100)
Potassium: 5.1 mEq/L (ref 3.5–5.1)
Protein, Total: 6.9 g/dL (ref 6.0–8.3)
Sodium: 130 mEq/L — ABNORMAL LOW (ref 136–145)

## 2018-05-04 LAB — GLUCOSE WHOLE BLOOD - POCT
Whole Blood Glucose POCT: >600 mg/dL (ref 70–100)
Whole Blood Glucose POCT: 503 mg/dL (ref 70–100)
Whole Blood Glucose POCT: 510 mg/dL (ref 70–100)
Whole Blood Glucose POCT: 351 mg/dL — ABNORMAL HIGH (ref 70–100)
Whole Blood Glucose POCT: 243 mg/dL — ABNORMAL HIGH (ref 70–100)
Whole Blood Glucose POCT: 182 mg/dL — ABNORMAL HIGH (ref 70–100)
Whole Blood Glucose POCT: 171 mg/dL — ABNORMAL HIGH (ref 70–100)
Whole Blood Glucose POCT: 155 mg/dL — ABNORMAL HIGH (ref 70–100)
Whole Blood Glucose POCT: 336 mg/dL — ABNORMAL HIGH (ref 70–100)

## 2018-05-04 LAB — CBC AND DIFFERENTIAL
Absolute NRBC: 0 10*3/uL (ref 0.00–0.00)
Basophils Absolute Automated: 0.1 10*3/uL — ABNORMAL HIGH (ref 0.00–0.08)
Basophils Automated: 1.5 %
Eosinophils Absolute Automated: 0.18 10*3/uL (ref 0.00–0.44)
Eosinophils Automated: 2.7 %
Hematocrit: 44.5 % (ref 37.6–49.6)
Hgb: 14.4 g/dL (ref 12.5–17.1)
Immature Granulocytes Absolute: 0.01 10*3/uL (ref 0.00–0.07)
Immature Granulocytes: 0.2 %
Lymphocytes Absolute Automated: 2.12 10*3/uL (ref 0.42–3.22)
Lymphocytes Automated: 32.2 %
MCH: 25.8 pg (ref 25.1–33.5)
MCHC: 32.4 g/dL (ref 31.5–35.8)
MCV: 79.7 fL (ref 78.0–96.0)
MPV: 11.4 fL (ref 8.9–12.5)
Monocytes Absolute Automated: 0.51 10*3/uL (ref 0.21–0.85)
Monocytes: 7.8 %
Neutrophils Absolute: 3.66 10*3/uL (ref 1.10–6.33)
Neutrophils: 55.6 %
Nucleated RBC: 0 /100 WBC (ref 0.0–0.0)
Platelets: 245 10*3/uL (ref 142–346)
RBC: 5.58 10*6/uL (ref 4.20–5.90)
RDW: 12 % (ref 11–15)
WBC: 6.58 10*3/uL (ref 3.10–9.50)

## 2018-05-04 LAB — BLOOD GAS, VENOUS
Base Excess, Ven: -8.3 mEq/L
HCO3, Ven: 17.4 mEq/L
O2 Sat, Venous: 89.6 %
Temperature: 98.6
Venous Total CO2: 18.6 mEq/L
pCO2, Venous: 38.1 mmHg
pH, Ven: 7.282
pO2, Venous: 69.3 mmHg

## 2018-05-04 LAB — BASIC METABOLIC PANEL
Anion Gap: 11 (ref 5.0–15.0)
BUN: 19 mg/dL (ref 9–28)
CO2: 22 mEq/L (ref 22–29)
Calcium: 9.3 mg/dL (ref 8.5–10.5)
Chloride: 106 mEq/L (ref 100–111)
Creatinine: 1.1 mg/dL (ref 0.7–1.3)
Glucose: 234 mg/dL — ABNORMAL HIGH (ref 70–100)
Potassium: 4 mEq/L (ref 3.5–5.1)
Sodium: 139 mEq/L (ref 136–145)

## 2018-05-04 LAB — URINALYSIS, REFLEX TO MICROSCOPIC EXAM IF INDICATED
Bilirubin, UA: NEGATIVE
Blood, UA: NEGATIVE
Glucose, UA: >=500 — AB
Ketones UA: 80 — AB
Leukocyte Esterase, UA: NEGATIVE
Nitrite, UA: NEGATIVE
Protein, UR: NEGATIVE
Specific Gravity UA: 1.026 (ref 1.001–1.035)
Urine pH: 6 (ref 5.0–8.0)
Urobilinogen, UA: NEGATIVE mg/dL (ref 0.2–2.0)

## 2018-05-04 LAB — MAGNESIUM: Magnesium: 1.9 mg/dL (ref 1.6–2.6)

## 2018-05-04 LAB — GFR
EGFR: >60
EGFR: >60

## 2018-05-04 LAB — PHOSPHORUS: Phosphorus: 1.8 mg/dL — ABNORMAL LOW (ref 2.3–4.7)

## 2018-05-04 MED ORDER — INSULIN GLARGINE 100 UNIT/ML SC SOLN
30.00 [IU] | Freq: Every day | SUBCUTANEOUS | Status: DC
Start: 2018-05-04 — End: 2018-05-05
  Administered 2018-05-04 – 2018-05-05 (×2): 30 [IU] via SUBCUTANEOUS
  Filled 2018-05-04 (×2): qty 30

## 2018-05-04 MED ORDER — ACETAMINOPHEN 325 MG PO TABS
650.0000 mg | ORAL_TABLET | ORAL | Status: DC | PRN
Start: 2018-05-04 — End: 2018-05-05

## 2018-05-04 MED ORDER — HEPARIN SODIUM (PORCINE) 5000 UNIT/ML IJ SOLN
5000.00 [IU] | Freq: Three times a day (TID) | INTRAMUSCULAR | Status: DC
Start: 2018-05-04 — End: 2018-05-05
  Administered 2018-05-04 – 2018-05-05 (×2): 5000 [IU] via SUBCUTANEOUS
  Filled 2018-05-04 (×4): qty 1

## 2018-05-04 MED ORDER — INSULIN REGULAR HUMAN 100 UNIT/ML IJ SOLN
5.00 [IU] | Freq: Once | INTRAMUSCULAR | Status: AC
Start: 2018-05-04 — End: 2018-05-04
  Administered 2018-05-04: 11:00:00 5 [IU] via INTRAVENOUS
  Filled 2018-05-04: qty 15

## 2018-05-04 MED ORDER — SODIUM CHLORIDE 0.9 % IV BOLUS
2000.00 mL | Freq: Once | INTRAVENOUS | Status: AC
Start: 2018-05-04 — End: 2018-05-04
  Administered 2018-05-04: 10:00:00 2000 mL via INTRAVENOUS

## 2018-05-04 MED ORDER — SODIUM CHLORIDE 0.9 % IV SOLN
0.10 [IU]/kg/h | INTRAVENOUS | Status: DC
Start: 2018-05-04 — End: 2018-05-04
  Administered 2018-05-04: 11:00:00 0.1 [IU]/kg/h via INTRAVENOUS
  Filled 2018-05-04: qty 1

## 2018-05-04 MED ORDER — INSULIN LISPRO 100 UNIT/ML SC SOLN
1.00 [IU] | Freq: Every evening | SUBCUTANEOUS | Status: DC
Start: 2018-05-04 — End: 2018-05-05
  Administered 2018-05-04: 22:00:00 2 [IU] via SUBCUTANEOUS
  Filled 2018-05-04: qty 6

## 2018-05-04 MED ORDER — SODIUM CHLORIDE 0.9 % IV SOLN
INTRAVENOUS | Status: DC
Start: 2018-05-04 — End: 2018-05-04

## 2018-05-04 MED ORDER — ONDANSETRON HCL 4 MG/2ML IJ SOLN
4.00 mg | Freq: Once | INTRAMUSCULAR | Status: AC
Start: 2018-05-04 — End: 2018-05-04
  Administered 2018-05-04: 10:00:00 4 mg via INTRAVENOUS
  Filled 2018-05-04: qty 2

## 2018-05-04 MED ORDER — SODIUM CHLORIDE 0.9 % IV SOLN
0.10 [IU]/kg/h | INTRAVENOUS | Status: DC
Start: 2018-05-04 — End: 2018-05-04

## 2018-05-04 MED ORDER — ONDANSETRON HCL 4 MG/2ML IJ SOLN
4.00 mg | Freq: Three times a day (TID) | INTRAMUSCULAR | Status: DC | PRN
Start: 2018-05-04 — End: 2018-05-05

## 2018-05-04 MED ORDER — GLUCAGON 1 MG IJ SOLR (WRAP)
1.00 mg | INTRAMUSCULAR | Status: DC | PRN
Start: 2018-05-04 — End: 2018-05-05

## 2018-05-04 MED ORDER — FLUTICASONE FUROATE-VILANTEROL 200-25 MCG/INH IN AEPB
1.00 | INHALATION_SPRAY | Freq: Every morning | RESPIRATORY_TRACT | Status: DC
Start: 2018-05-04 — End: 2018-05-05
  Administered 2018-05-05: 1 via RESPIRATORY_TRACT
  Filled 2018-05-04: qty 14

## 2018-05-04 MED ORDER — PLASMA-LYTE A IV INFUSION
INTRAVENOUS | Status: AC
Start: 2018-05-04 — End: 2018-05-05

## 2018-05-04 MED ORDER — DEXTROSE 10 % IV BOLUS
125.00 mL | INTRAVENOUS | Status: DC | PRN
Start: 2018-05-04 — End: 2018-05-05

## 2018-05-04 MED ORDER — ALBUTEROL-IPRATROPIUM 2.5-0.5 (3) MG/3ML IN SOLN
3.00 mL | Freq: Four times a day (QID) | RESPIRATORY_TRACT | Status: DC
Start: 2018-05-04 — End: 2018-05-05
  Administered 2018-05-04 – 2018-05-05 (×5): 3 mL via RESPIRATORY_TRACT
  Filled 2018-05-04 (×5): qty 3

## 2018-05-04 MED ORDER — DEXTROSE-NACL 5-0.9 % IV SOLN
INTRAVENOUS | Status: DC
Start: 2018-05-04 — End: 2018-05-04

## 2018-05-04 MED ORDER — INSULIN LISPRO 100 UNIT/ML SC SOLN
1.00 [IU] | Freq: Three times a day (TID) | SUBCUTANEOUS | Status: DC
Start: 2018-05-04 — End: 2018-05-05
  Administered 2018-05-05: 14:00:00 4 [IU] via SUBCUTANEOUS
  Administered 2018-05-05: 09:00:00 1 [IU] via SUBCUTANEOUS
  Filled 2018-05-04: qty 12
  Filled 2018-05-04: qty 3

## 2018-05-04 MED ORDER — GLUCOSE 40 % PO GEL
15.00 g | ORAL | Status: DC | PRN
Start: 2018-05-04 — End: 2018-05-05

## 2018-05-04 NOTE — ED Provider Notes (Signed)
EMERGENCY DEPARTMENT NOTE    Physician/Midlevel provider first contact with patient: 05/04/18 0937         HISTORY OF PRESENT ILLNESS   Historian: patient, ems  Translator Used: no    22 y.o. male presents with nausea, vomiting, elevated blood sugar.    1. Location of symptoms: n/v, elevated bs  2. Onset of symptoms: this morning, 4 am  3. What was patient doing when symptoms started (Context): dm I, on lantus and novolog, took both last night, ate pizza last night, states bs in 170's last night  4. Severity: severe  5. Timing: gradual onset, constant  6. Activities that worsen symptoms: none  7. Activities that improve symptoms: none  8. Quality: nausea, vomiting x 3, no blood in vomitus, elevated blood sugar- too high for ems to measure   9. Radiation of symptoms: none  10. Associated signs and Symptoms: none   11. Are symptoms worsening? yes          MEDICAL HISTORY     Past Medical History:  Past Medical History:   Diagnosis Date   . Diabetes mellitus    . Seasonal asthma        Past Surgical History:  History reviewed. No pertinent surgical history.    Social History:  Social History     Socioeconomic History   . Marital status: Single     Spouse name: Not on file   . Number of children: Not on file   . Years of education: Not on file   . Highest education level: Not on file   Occupational History   . Not on file   Social Needs   . Financial resource strain: Not on file   . Food insecurity:     Worry: Not on file     Inability: Not on file   . Transportation needs:     Medical: Not on file     Non-medical: Not on file   Tobacco Use   . Smoking status: Current Every Day Smoker     Packs/day: 0.50   . Smokeless tobacco: Never Used   Substance and Sexual Activity   . Alcohol use: Yes     Comment: socially    . Drug use: Yes     Comment: marijuana a couple times a week    . Sexual activity: Not on file   Lifestyle   . Physical activity:     Days per week: Not on file     Minutes per session: Not on file   .  Stress: Not on file   Relationships   . Social connections:     Talks on phone: Not on file     Gets together: Not on file     Attends religious service: Not on file     Active member of club or organization: Not on file     Attends meetings of clubs or organizations: Not on file     Relationship status: Not on file   . Intimate partner violence:     Fear of current or ex partner: Not on file     Emotionally abused: Not on file     Physically abused: Not on file     Forced sexual activity: Not on file   Other Topics Concern   . Not on file   Social History Narrative   . Not on file       Family History:  History reviewed. No pertinent family history.  Outpatient Medication:  Previous Medications    ACCU-CHEK FASTCLIX LANCETS MISC    Check blood sugar 7 times daily, as directed by MD.    GLUCOSE BLOOD (ACCU-CHEK AVIVA PLUS) TEST STRIP    Check blood sugar 7 times daily, as directed by MD.    INSULIN ASPART (NOVOLOG) 100 UNIT/ML INJECTION    Administer via carb count protocal PRN    INSULIN GLARGINE (LANTUS) 100 UNIT/ML INJECTION    Inject 38 Units into the skin nightly    INSULIN LISPRO (HUMALOG) 100 UNIT/ML INJECTION    Administer via carb count protocal       Allergies:  Allergies   Allergen Reactions   . Cephalosporins    . Gantrisin [Sulfisoxazole]        REVIEW OF SYSTEMS   Review of Systems   Constitutional: Positive for malaise/fatigue.   Gastrointestinal: Positive for nausea and vomiting. Negative for abdominal pain and diarrhea.   Neurological: Positive for weakness.   All other systems reviewed and are negative.        PHYSICAL EXAM     Vitals:    05/04/18 0937   BP: 126/72   Pulse: 99   Resp: 18   Temp: 98.3 F (36.8 C)   SpO2: 98%       Nursing note and vitals reviewed.    Constitutional: non-toxic  Head: Atraumatic.  Eyes: PERRL. EOMI. No scleral icterus.  ENT: Mucous membranes are moist and intact. Oropharynx is clear. Patent airway.  Neck: Supple. No cervical lymphadenopathy.  Cardiovascular:  Regular rate. Regular rhythm. No murmurs, rubs, or gallops.  Pulmonary/Chest: No evidence of respiratory distress. Clear to auscultation bilaterally. No wheezing, rales or rhonchi.   GI: Soft, non-distended abdomen. No tenderness to palpation of abdomen.  Extremities: No edema. No deformity.  Skin: No rash.   Neurological: Awake, alert and oriented x 3. CN II-XII intact. Strength intact. Sensation intact.  Psychiatric: Appropriate affect. Appropriate mood. Appropriate behavior.    MEDICAL DECISION MAKING   Hyperglycemia, nausea, vomiting. Suspect dka.  Reviewed labs:  Dka, normal potassium, elevated cr. Dehydrated. Starting insulin drip. Giving an insulin bolus. Admitting to Dr. Leron Croak in CCU.  Giving iv fluid bolus for dehydration    Critical Care  Performed by: Marland Mcalpine, MD  Authorized by: Marland Mcalpine, MD     Critical care provider statement:     Critical care time (minutes):  30    Critical care time was exclusive of:  Separately billable procedures and treating other patients    Critical care was necessary to treat or prevent imminent or life-threatening deterioration of the following conditions:  Metabolic crisis and endocrine crisis    Critical care was time spent personally by me on the following activities:  Development of treatment plan with patient or surrogate, discussions with consultants, evaluation of patient's response to treatment, examination of patient, obtaining history from patient or surrogate, ordering and performing treatments and interventions, ordering and review of laboratory studies, pulse oximetry, re-evaluation of patient's condition and review of old charts          DISCUSSION      Vital Signs: Reviewed the patient?s vital signs.   Nursing Notes: Reviewed and utilized available nursing notes.  Medical Records Reviewed: Reviewed available past medical records.  Counseling: The emergency provider has spoken with the patient and discussed today?s findings, in  addition to providing specific details for the plan of care.  Questions are answered and there is agreement with the  plan.    IMAGING STUDIES    The following imaging studies were reviewed by the Emergency Medicine Physician.  For full imaging study results please see chart.    No orders to display       CARDIAC STUDIES     The following cardiac studies were independently interpreted by the Emergency Medicine Physician. For full cardiac study results please see chart     PULSE OXIMETRY    Oxygen Saturation by Pulse Oximetry: 98% RA  Interventions: none  Interpretation: normal    EMERGENCY DEPT. MEDICATIONS      ED Medication Orders (From admission, onward)    Start Ordered     Status Ordering Provider    05/04/18 1110 05/04/18 1109  insulin regular (HumuLIN R,NovoLIN R) 100 Units in sodium chloride 0.9 % 100 mL IV infusion  Continuous     Route: Intravenous  Ordered Dose: 0.1 Units/kg/hr     Constance Goltz [facility]    05/04/18 1110 05/04/18 1109  insulin regular (HumuLIN R,NovoLIN R) injection 5 Units  Once     Route: Intravenous  Ordered Dose: 5 Units     Last MAR action:  Pending Ladoris Gene [facility]    05/04/18 0946 05/04/18 0945  ondansetron (ZOFRAN) injection 4 mg  Once     Route: Intravenous  Ordered Dose: 4 mg     Last MAR action:  Given Ladoris Gene [facility]    05/04/18 0945 05/04/18 0944  sodium chloride 0.9 % bolus 2,000 mL  Once     Route: Intravenous  Ordered Dose: 2,000 mL     Last MAR action:  New Bag Marybeth Dandy [facility]          LABORATORY RESULTS    Ordered and independently interpreted AVAILABLE laboratory tests. Please see results section in chart for full details.  Results for orders placed or performed during the hospital encounter of 05/04/18   Comprehensive Metabolic Panel (CMP)   Result Value Ref Range    Glucose 723 (HH) 70 - 100 mg/dL    BUN 23 9 - 28 mg/dL    Creatinine 1.7 (H) 0.7 - 1.3 mg/dL    Sodium 540 (L) 981 - 145 mEq/L    Potassium 5.1 3.5 - 5.1 mEq/L     Chloride 92 (L) 100 - 111 mEq/L    CO2 16 (L) 22 - 29 mEq/L    Calcium 10.2 8.5 - 10.5 mg/dL    Protein, Total 6.9 6.0 - 8.3 g/dL    Albumin 4.3 3.5 - 5.0 g/dL    AST (SGOT) 23 5 - 34 U/L    ALT 19 0 - 55 U/L    Alkaline Phosphatase 192 (H) 38 - 106 U/L    Bilirubin, Total 0.8 0.2 - 1.2 mg/dL    Globulin 2.6 2.0 - 3.6 g/dL    Albumin/Globulin Ratio 1.7 0.9 - 2.2    Anion Gap 22.0 (H) 5.0 - 15.0   CBC with differential   Result Value Ref Range    WBC 6.58 3.10 - 9.50 x10 3/uL    Hgb 14.4 12.5 - 17.1 g/dL    Hematocrit 19.1 47.8 - 49.6 %    Platelets 245 142 - 346 x10 3/uL    RBC 5.58 4.20 - 5.90 x10 6/uL    MCV 79.7 78.0 - 96.0 fL    MCH 25.8 25.1 - 33.5 pg    MCHC 32.4 31.5 - 35.8 g/dL    RDW 12 11 - 15 %  MPV 11.4 8.9 - 12.5 fL    Neutrophils 55.6 None %    Lymphocytes Automated 32.2 None %    Monocytes 7.8 None %    Eosinophils Automated 2.7 None %    Basophils Automated 1.5 None %    Immature Granulocyte 0.2 None %    Nucleated RBC 0.0 0.0 - 0.0 /100 WBC    Neutrophils Absolute 3.66 1.10 - 6.33 x10 3/uL    Abs Lymph Automated 2.12 0.42 - 3.22 x10 3/uL    Abs Mono Automated 0.51 0.21 - 0.85 x10 3/uL    Abs Eos Automated 0.18 0.00 - 0.44 x10 3/uL    Absolute Baso Automated 0.10 (H) 0.00 - 0.08 x10 3/uL    Absolute Immature Granulocyte 0.01 0.00 - 0.07 x10 3/uL    Absolute NRBC 0.00 0.00 - 0.00 x10 3/uL   GFR   Result Value Ref Range    EGFR >60.0    Blood Gas-Venous   Result Value Ref Range    pH, Ven 7.282 None Estab.    pCO2, Venous 38.1 None Estab. mmHg    pO2, Venous 69.3 None Estab. mmHg    HCO3, Ven 17.4 None Estab. mEq/L    Venous Total CO2 18.6 None Estab. mEq/L    Base Excess, Ven -8.3 None Estab. mEq/L    O2 Sat, Venous 89.6 None Estab. %    Temperature 98.6    UA, Reflex to Microscopic - No Culture   Result Value Ref Range    Urine Type Clean Catch     Color, UA Colorless Colorless - Yellow    Clarity, UA Clear Clear - Hazy    Specific Gravity UA 1.026 1.001 - 1.035    Urine pH 6.0 5.0 - 8.0     Leukocyte Esterase, UA Negative Negative    Nitrite, UA Negative Negative    Protein, UR Negative Negative    Glucose, UA >=500 (A) Negative    Ketones UA 80 (A) Negative    Urobilinogen, UA Negative 0.2 - 2.0 mg/dL    Bilirubin, UA Negative Negative    Blood, UA Negative Negative   Glucose Whole Blood - POCT   Result Value Ref Range    POCT - Glucose Whole blood >600 (HH) 70 - 100 mg/dL   Glucose Whole Blood - POCT   Result Value Ref Range    POCT - Glucose Whole blood 503 (HH) 70 - 100 mg/dL       ATTESTATIONS      Physician Attestation: Darlyn Read MD, have been the primary provider for Suan Halter. during this Emergency Dept visit and have reviewed the chart for accuracy and agree with its content.     DIAGNOSIS      Diagnosis:  Final diagnoses:   DKA, type 1, not at goal       Disposition:  ED Disposition     ED Disposition Condition Date/Time Comment    Admit  Thu May 04, 2018 11:12 AM Admitting Physician: Galena Crews, Redmond Baseman [[id number]]   Diagnosis: DKA (diabetic ketoacidoses) [161096]   Estimated Length of Stay: > or = to 2 midnights   Tentative Discharge Plan?: Home or Self Care [1]   Patient Class: Inpatient [101]            Prescriptions:  Patient's Medications   New Prescriptions    No medications on file   Previous Medications    ACCU-CHEK FASTCLIX LANCETS MISC    Check blood sugar 7  times daily, as directed by MD.    GLUCOSE BLOOD (ACCU-CHEK AVIVA PLUS) TEST STRIP    Check blood sugar 7 times daily, as directed by MD.    INSULIN ASPART (NOVOLOG) 100 UNIT/ML INJECTION    Administer via carb count protocal PRN    INSULIN GLARGINE (LANTUS) 100 UNIT/ML INJECTION    Inject 38 Units into the skin nightly    INSULIN LISPRO (HUMALOG) 100 UNIT/ML INJECTION    Administer via carb count protocal   Modified Medications    No medications on file   Discontinued Medications    No medications on file          Marland Mcalpine, MD  05/04/18 1122

## 2018-05-04 NOTE — EDIE (Signed)
COLLECTIVE?NOTIFICATION?05/04/2018 09:35?[name], [name]?MRN: [id number]    [place] [facility]'s patient encounter information:   MRN:[id number]  Account [id number]  Billing Account [id number]      Criteria Met      5 ED Visits in 12 Months    Security and Safety  No recent Security Events currently on file    ED Care Guidelines  There are currently no ED Care Guidelines for this patient. Please check your facility's medical records system.      Prescription Monitoring Program  PDMP query found no report.  Narx Score not available at this time.      E.D. Visit Count (12 mo.)  Facility Visits   [place] [facility] 2   [facility] 2   Total 4   Note: Visits indicate total known visits.      Recent Emergency Department Visit Summary  Date Facility [facility] Type Diagnoses or Chief Complaint   May 04, 2018 [place] [facility] [place] [facility]      nausea, emesis      Apr 19, 2018  [facility] [place] [facility]      med refill      Medication Refill      Hyperglycemia, unspecified      Nov 24, 2017 [facility] [place] [place] [facility]     Sep 06, 2017 [facility] [place] [place] [facility]     Jul 25, 2017 [place] [facility] [employer]         Recent Inpatient Visit Summary  No recorded inpatient visits.     Care Team  There is not a care team on record at this time.   Collective Portal  This patient has registered at the [facility] Emergency Department   For more information visit: [email or web address]     PLEASE NOTE:    1.   Any care recommendations and other clinical information are provided as guidelines or for historical purposes only, and providers should exercise their own clinical judgment when providing care.    2.   You may only use this information for purposes of treatment, payment or health care operations activities, and  subject to the limitations of applicable Collective Policies.    3.   You should consult directly with the organization that provided a care guideline or other clinical history with any questions about additional information or accuracy or completeness of information provided.    ? 2019 [place], [employer] - [email or web address]

## 2018-05-04 NOTE — H&P (Addendum)
Critical Care Attending Note: Luz Brazen, MD     [facility]  Critical Care Admission Note   Note Date: 05/04/2018  Note Time: 3:30 PM    Patient: [name] [name].  MRN: [id number]  Age/Sex: 22 y.o. male  DOB: 1995-10-12  Code Status: Full Code  Date of Admission: 05/04/2018       Brief Historical Review:   Nikolaj Geraghty. is a 22 y.o. male with hx of T1DM (dx at age of 78, reports that he has hx of prior non-adherence to insulin until recently seen in [facility] ED ~ 2 weeks ago when lantus and novolog was prescribed for him.  He reports compliance since then.  Home regimen of insulin includes lantus insulin 38 units qhs and novolog sliding scale pre-meals per carbohydrate count.  Although reports that he does not follow with any physician on regular basis.   He states that he checks his BS at home up to 5x/day.      He reports that he was feeling well last night, had pizza.  Overnight he had polyuria and could not get much sleep.  He also had nausea which has since been resolved.  He denies diarrhea.  He came to ED today because of changes in his vision which has now resolved.  He also reported some shortness of breath and chest tightness.  He has hx of seasonal asthma (severity unknown) but does not use inhalers on regular basis due to lack of physician follow up.  He reports frequent nocturnal wheezes at baseline.      At this time denies cough, sputum production or URI symptoms.  No sick contacts.     Reports social EtOH and tobacco use.   Does not use any other medications and denies any other medical hx otherwise.  Reports that this is his 5th admission for DKA.       In the ED:  He was found to be in DKA.  Given IVF x 2L bolus and started on iv insulin gtt.       Physical Exam:     Vital Signs:  BP 112/60   Pulse 79   Temp 98.3 F (36.8 C) (Oral)   Resp 22   Ht 1.702 m (5\' 7" )   Wt 61.5 kg (135 lb 9.6 oz)   SpO2 93%   BMI 21.24 kg/m     GENERAL: Alert, awake, cooperative  HEENT:  non-icteric, no pallor.   NECK: supple.   LUNGS: diffuse exp wheezes bilaterally.   HEART: RRR  ABD: soft, NT, ND.   EXT: no edema.   NEURO: MAE x 4.  Grossly non-focal.   Skin: no rashes.   Catheters/Drains: none.     Ventilator Settings:        Radiologic Images Reviewed:     No imaging done this admission as of today 10/31.     Input / Output:              Intake/Output Summary (Last 24 hours) at 05/04/2018 1530  Last data filed at 05/04/2018 1500  Gross per 24 hour   Intake 39.97 ml   Output -   Net 39.97 ml      Critical Care Issues:     1. DKA (unclear trigger but I suspect potential lack of compliance with insulin).  2. Type 1 diabetes (dx at age of 8).  3. Smoker.  4. Asthma with wheezes on exam (on admission).  5. Dehydration due  to above.   6. AKI due to dehydration/pre-renal status.   7. Pseudohyponatremia.   8. AGMA due to DKA.     Main ICU Plans:     Neuro:   - Stable, monitor.       Respiratory: Bronchospasm.  Wheezing on exam.  Seasonal asthma reported by patient.  Frequent nocturnal symptoms suggestive of uncontrolled asthma.  Hx of smoking.  - Maintain O2 sat>94%  - duoNEBs q6.  - Start LABA/ICS (Breo Ellipta).   Needs outpatient follow up with step down approach to asthma management.   - Smoking cessation.       Cardiovascular:   - MAP goal>65  - Stable hemodynamics.       GI:   - Bowel regimen: none.   - Nutrition: NPO for now.   - GI ppx: n/a.       Renal: AKI on admission due to hypovolemia/dehydration in setting of DKA.   - Monitor UOP, strict I/Os, trend BUN/Cr, daily weights  - Monitor and correct lytes prn  - Maintenance IVF: as per DKA protocol.   - Foley: n/a.       Hematology:   - No signs of active bleeding  - Trend H/H, plts, coags  - DVT ppx: SCDs and sq heparin.       ID: No obvious infectious etiology identified at this time.  - Monitor temperature curve, trend WBC      Endo: DKA on presentation.   - Follow DKA protocol.  - Aggressive fluid administration/hydration and iv insulin  gtt.  - Aggressive repletion of lytes.   - Change IVF to dextrose containing fluid alongside insulin gtt until once BS < 250 and continue per protocol until AG closes.   - Switch/transition to long acting insulin once DKA resolved.  - Will check HgA1c.         Dispo: admitted earlier 10/31 to ICU for DKA management.     Family: plan of care d/w patient.     My cumulative time taking care of this critically ill patient (exclusive of procedures) is 35 minutes.          Greater than 50% spent in counseling and/or coordination of care regarding current medical management and future plan of care         Line, Drains,Airway:   Patient Lines/Drains/Airways Status    Active PICC Line / CVC Line / PIV Line / Drain / Airway / Intraosseous Line / Epidural Line / ART Line / Line / Wound / Pressure Ulcer / NG/OG Tube     Name:   Placement date:   Placement time:   Site:   Days:    Peripheral IV 05/04/18 Left Wrist   05/04/18    -    Wrist   less than 1    Peripheral IV 05/04/18 Right Antecubital   05/04/18    0946    Antecubital   less than 1                Hospital Problem List:  Active Hospital Problems    Diagnosis   . DKA (diabetic ketoacidoses)     Past Medical/Surgical History:  Past Medical History:   Diagnosis Date   . Diabetes mellitus    . Seasonal asthma     History reviewed. No pertinent surgical history.   Allergies:  He is allergic to cephalosporins and gantrisin [sulfisoxazole].    Home Medications:  The following is a compilation of best available information  at the time of admission.  Other sources may be utilized to further obtain medication histories and/or to clarify conflicting information, if applicable.   No current facility-administered medications on file prior to encounter.      Current Outpatient Medications on File Prior to Encounter   Medication Sig Dispense Refill   . ACCU-CHEK FASTCLIX LANCETS Misc Check blood sugar 7 times daily, as directed by MD. 204 each 11   . glucose blood (ACCU-CHEK AVIVA  PLUS) test strip Check blood sugar 7 times daily, as directed by MD. 200 each 11   . insulin aspart (NOVOLOG) 100 UNIT/ML injection Administer via carb count protocal PRN 1 pen 3   . insulin glargine (LANTUS) 100 UNIT/ML injection Inject 38 Units into the skin nightly 1 pen 3   . insulin lispro (HUMALOG) 100 UNIT/ML injection Administer via carb count protocal 1 pen 3       Family History:  History reviewed. No pertinent family history.  Social History:  Social History     Socioeconomic History   . Marital status: Single     Spouse name: Not on file   . Number of children: Not on file   . Years of education: Not on file   . Highest education level: Not on file   Occupational History   . Not on file   Social Needs   . Financial resource strain: Not on file   . Food insecurity:     Worry: Not on file     Inability: Not on file   . Transportation needs:     Medical: Not on file     Non-medical: Not on file   Tobacco Use   . Smoking status: Current Every Day Smoker     Packs/day: 0.50   . Smokeless tobacco: Never Used   Substance and Sexual Activity   . Alcohol use: Yes     Comment: socially    . Drug use: Yes     Comment: marijuana a couple times a week    . Sexual activity: Not on file   Lifestyle   . Physical activity:     Days per week: Not on file     Minutes per session: Not on file   . Stress: Not on file   Relationships   . Social connections:     Talks on phone: Not on file     Gets together: Not on file     Attends religious service: Not on file     Active member of club or organization: Not on file     Attends meetings of clubs or organizations: Not on file     Relationship status: Not on file   . Intimate partner violence:     Fear of current or ex partner: Not on file     Emotionally abused: Not on file     Physically abused: Not on file     Forced sexual activity: Not on file   Other Topics Concern   . Not on file   Social History Narrative   . Not on file       Review of Systems:  10 point ROS was  reviewed with the patient and pertinent findings are noted in the HPI.  ROS is otherwise negative or unobtainable due to acuity of illness.      SCHEDULED MEDICATIONS: PRN MEDS:   Current Facility-Administered Medications   Medication Dose Route Frequency   . albuterol-ipratropium  3 mL Nebulization Q6H  SCH   . heparin (porcine)  5,000 Units Subcutaneous Q8H SCH    acetaminophen, dextrose, ondansetron   INFUSION MEDS:    . dextrose 5 % and 0.9% NaCl     . insulin regular 0.049 Units/kg/hr (05/04/18 1500)         Labs Reviewed:  Results     Procedure Component Value Units Date/Time    Glucose Whole Blood - POCT [[id number]]  (Abnormal) Collected:  05/04/18 1446     Updated:  05/04/18 1454     POCT - Glucose Whole blood 182 mg/dL     Basic Metabolic Panel (every 4 hours until anion gap is less than 12, then evey 12 hours) [[id number]] Collected:  05/04/18 1427    Specimen:  Blood Updated:  05/04/18 1435    Narrative:       To begin when Anion Gap is less than 12.    Magnesium (Q4H until Anion Gap less than 12 then change  to Q12H) [[id number]] Collected:  05/04/18 1427    Specimen:  Blood Updated:  05/04/18 1435    Narrative:       To begin when Anion Gap is less than 12.    Phosphorus (Q4H until Anion Gap less than 12 then change  to Q12H) [[id number]] Collected:  05/04/18 1427    Specimen:  Blood Updated:  05/04/18 1435    Narrative:       To begin when Anion Gap is less than 12.    Glucose Whole Blood - POCT [[id number]]  (Abnormal) Collected:  05/04/18 1350     Updated:  05/04/18 1356     POCT - Glucose Whole blood 243 mg/dL     MRSA culture - Nares [[id number]] Collected:  05/04/18 1247    Specimen:  Body Fluid from Nares Updated:  05/04/18 1247    MRSA culture - Throat [[id number]] Collected:  05/04/18 1247    Specimen:  Body Fluid from Throat Updated:  05/04/18 1247    Glucose Whole Blood - POCT [[id number]]  (Abnormal) Collected:  05/04/18 1228     Updated:  05/04/18 1231     POCT - Glucose Whole blood 351 mg/dL      Glucose Whole Blood - POCT [[id number]]  (Abnormal) Collected:  05/04/18 1125     Updated:  05/04/18 1131     POCT - Glucose Whole blood 510 mg/dL     Glucose Whole Blood - POCT [[id number]]  (Abnormal) Collected:  05/04/18 1054     Updated:  05/04/18 1057     POCT - Glucose Whole blood 503 mg/dL     Comprehensive Metabolic Panel (CMP) [[id number]]  (Abnormal) Collected:  05/04/18 0947    Specimen:  Blood Updated:  05/04/18 1049     Glucose 723 mg/dL      BUN 23 mg/dL      Creatinine 1.7 mg/dL      Sodium 322 mEq/L      Potassium 5.1 mEq/L      Chloride 92 mEq/L      CO2 16 mEq/L      Calcium 10.2 mg/dL      Protein, Total 6.9 g/dL      Albumin 4.3 g/dL      AST (SGOT) 23 U/L      ALT 19 U/L      Alkaline Phosphatase 192 U/L      Bilirubin, Total 0.8 mg/dL      Globulin 2.6 g/dL      Albumin/Globulin Ratio 1.7  Anion Gap 22.0    GFR [[id number]] Collected:  05/04/18 0947     Updated:  05/04/18 1049     EGFR >60.0    UA, Reflex to Microscopic - No Culture [[id number]]  (Abnormal) Collected:  05/04/18 1013    Specimen:  Urine Updated:  05/04/18 1031     Urine Type Clean Catch     Color, UA Colorless     Clarity, UA Clear     Specific Gravity UA 1.026     Urine pH 6.0     Leukocyte Esterase, UA Negative     Nitrite, UA Negative     Protein, UR Negative     Glucose, UA >=500     Ketones UA 80     Urobilinogen, UA Negative mg/dL      Bilirubin, UA Negative     Blood, UA Negative    Blood Gas-Venous [[id number]] Collected:  05/04/18 0947    Specimen:  Blood Updated:  05/04/18 1022     pH, Ven 7.282     pCO2, Venous 38.1 mmHg      pO2, Venous 69.3 mmHg      HCO3, Ven 17.4 mEq/L      Venous Total CO2 18.6 mEq/L      Base Excess, Ven -8.3 mEq/L      O2 Sat, Venous 89.6 %      Temperature 98.6    CBC with differential [[id number]]  (Abnormal) Collected:  05/04/18 0947    Specimen:  Blood Updated:  05/04/18 1007     WBC 6.58 x10 3/uL      Hgb 14.4 g/dL      Hematocrit 62.9 %      Platelets 245 x10 3/uL      RBC 5.58 x10 6/uL       MCV 79.7 fL      MCH 25.8 pg      MCHC 32.4 g/dL      RDW 12 %      MPV 11.4 fL      Neutrophils 55.6 %      Lymphocytes Automated 32.2 %      Monocytes 7.8 %      Eosinophils Automated 2.7 %      Basophils Automated 1.5 %      Immature Granulocyte 0.2 %      Nucleated RBC 0.0 /100 WBC      Neutrophils Absolute 3.66 x10 3/uL      Abs Lymph Automated 2.12 x10 3/uL      Abs Mono Automated 0.51 x10 3/uL      Abs Eos Automated 0.18 x10 3/uL      Absolute Baso Automated 0.10 x10 3/uL      Absolute Immature Granulocyte 0.01 x10 3/uL      Absolute NRBC 0.00 x10 3/uL     Glucose Whole Blood - POCT [[id number]]  (Abnormal) Collected:  05/04/18 0943     Updated:  05/04/18 0946     POCT - Glucose Whole blood >600 mg/dL         Microbiology:  Microbiology Results     Procedure Component Value Units Date/Time    MRSA culture - Nares [[id number]] Collected:  05/04/18 1247    Specimen:  Body Fluid from Nares Updated:  05/04/18 1247    MRSA culture - Throat [[id number]] Collected:  05/04/18 1247    Specimen:  Body Fluid from Throat Updated:  05/04/18 1247        Radiologic Image Reports Reviewed:  No orders to display       I have reviewed relevant laboratory results, microbiology results, consultation notes, outside records, pathology results and other progress notes which may not be part of this note.  Radiology images and all laboratory data were independently reviewed in the EHR.       Luz Brazen, MD  05/04/18;  3:30 PM

## 2018-05-04 NOTE — ED Notes (Signed)
Bed: E11  Expected date:   Expected time:   Means of arrival:   Comments:  Medic 424

## 2018-05-04 NOTE — Significant Event (Signed)
Patient transferred out to Medical floor alert and rational in no cardiopulmonary distress. Patient denies pain or dizziness at present. Maintenance fluid Plasma Lyte 125 ml/hr infusing to left forearm. IV access to right AC flushed and saline locked, no infiltration noted.  Patients mom [name] [name] called and informed of patients transfer out to Medical floor room 333.

## 2018-05-04 NOTE — Plan of Care (Addendum)
Problem: Safety  Goal: Patient will be free from injury during hospitalization  Outcome: Progressing  Flowsheets (Taken 05/04/2018 1421)  Patient will be free from injury during hospitalization : Assess patient's risk for falls and implement fall prevention plan of care per policy; Ensure appropriate safety devices are available at the bedside; Assess for patients risk for elopement and implement Elopement Risk Plan per policy; Include patient/ family/ care giver in decisions related to safety; Provide and maintain safe environment; Use appropriate transfer methods; Hourly rounding  Note:   Patient encourage to call for assistance ,bed alarm on at all times.  Goal: Patient will be free from infection during hospitalization  Outcome: Progressing  Flowsheets (Taken 05/04/2018 1421)  Free from Infection during hospitalization: Assess and monitor for signs and symptoms of infection; Monitor all insertion sites (i.e. indwelling lines, tubes, urinary catheters, and drains); Monitor lab/diagnostic results; Encourage patient and family to use good hand hygiene technique  Note:   Afebrile ,will monitor daily labs & serial labs & cultures results.     Problem: Pain  Goal: Pain at adequate level as identified by patient  Outcome: Progressing  Flowsheets (Taken 05/04/2018 1421)  Pain at adequate level as identified by patient: Identify patient comfort function goal; Assess for risk of opioid induced respiratory depression, including snoring/sleep apnea. Alert healthcare team of risk factors identified.; Assess pain on admission, during daily assessment and/or before any "as needed" intervention(s); Reassess pain within 30-60 minutes of any procedure/intervention, per Pain Assessment, Intervention, Reassessment (AIR) Cycle; Evaluate if patient comfort function goal is met; Evaluate patient's satisfaction with pain management progress; Offer non-pharmacological pain management interventions; Include patient/patient care  companion in decisions related to pain management as needed  Note:   Patien denies any pain or discomfort.     Problem: Side Effects from Pain Analgesia  Goal: Patient will experience minimal side effects of analgesic therapy  Outcome: Progressing  Flowsheets (Taken 05/04/2018 1421)  Patient will experience minimal side effects of analgesic therapy: Monitor/assess patient's respiratory status (RR depth, effort, breath sounds); Assess for changes in cognitive function; Prevent/manage side effects per LIP orders (i.e. nausea, vomiting, pruritus, constipation, urinary retention, etc.)  Note:   No pain meds or narcotics needed this shift.     Problem: Discharge Barriers  Goal: Patient will be discharged home or other facility with appropriate resources  Outcome: Progressing  Flowsheets (Taken 05/04/2018 1421)  Discharge to home or other facility with appropriate resources: Provide appropriate patient education; Provide information on available health resources  Note:   No needs at this time, will be able to go home once cleared for discharge,Patient ambulatory w/ steady gait.     Problem: Psychosocial and Spiritual Needs  Goal: Demonstrates ability to cope with hospitalization/illness  Outcome: Progressing  Flowsheets (Taken 05/04/2018 1421)  Demonstrates ability to cope with hospitalizations/illness: Encourage verbalization of feelings/concerns/expectations; Provide quiet environment; Assist patient to identify own strengths and abilities; Reinforce positive adaptation of new coping behaviors; Encourage participation in diversional activity; Encourage patient to set small goals for self; Include patient/ patient care companion in decisions  Note:   Patient smoker ,smoking cessation education initiated.No family at bedside ,was able to talk at length w/ Dr Leron Croak.     Problem: Fluid and Electrolyte Imbalance/ Endocrine  Goal: Fluid and electrolyte balance are achieved/maintained  Outcome: Progressing  Flowsheets (Taken  05/04/2018 1421)  Fluid and electrolyte balance are achieved/maintained: Monitor intake and output every shift; Monitor/assess lab values and report abnormal values; Provide adequate hydration;  Assess and reassess fluid and electrolyte status; Monitor daily weight; Assess for confusion/personality changes; Observe for seizure activity and initiate seizure precautions if indicated; Observe for cardiac arrhythmias; Monitor for muscle weakness  Note:   Patient admitted for AKI/Dehydration & hyponatremia,Patient kept NPO ,started on IVF, will follow up serial labs & adjust Insulin drip as per DKA protocol.& replete lytes as needed.  Goal: Adequate hydration  Outcome: Progressing  Flowsheets (Taken 05/04/2018 1421)  Adequate hydration: Assess mucus membranes, skin color, turgor, perfusion and presence of edema; Assess for peripheral, sacral, periorbital and abdominal edema; Monitor and assess vital signs and perfusion  Note:   Kept NPO ,admitted w/ Dehydration,started on IVF.     Problem: Nutrition  Goal: Nutritional intake is adequate  Outcome: Progressing  Flowsheets (Taken 05/04/2018 1421)  Nutritional intake is adequate: Monitor daily weights; Encourage/perform oral hygiene as appropriate; Include patient/patient care companion in decisions related to nutrition  Note:   Admitted w/ Nausea /Vomiting,at present kept NPO , no nausea/vomiting at this time.     Problem: Diabetes: Glucose Imbalance  Goal: Blood glucose stable at established goal  Outcome: Progressing  Flowsheets (Taken 05/04/2018 1421)  Blood glucose stable at established goal : Monitor lab values; Monitor intake and output.  Notify LIP if urine output is < 30 mL/hour.; Follow fluid restrictions/IV/PO parameters; Assess for hypoglycemia /hyperglycemia; Monitor/assess vital signs; Include patient/family in decisions related to nutrition/dietary selections; Coordinate medication administration with meals, as indicated; Ensure adequate hydration; Ensure  appropriate diet and assess tolerance; Ensure patient/family has adequate teaching materials; Ensure appropriate consults are obtained (Nutrition, Diabetes Education, and Case Management)  Note:   Admitted w/ DKA, will continue Insulin drip & adjust as per DKA protocol & continue accucheck Q hour. Kept NPO for now ,maintain on IVF & will follow up serial labs.     Problem: Inadequate Gas Exchange  Goal: Adequate oxygenation and improved ventilation  Outcome: Progressing  Flowsheets (Taken 05/04/2018 1421)  Adequate oxygenation and improved ventilation: Assess lung sounds; Monitor SpO2 and treat as needed; Provide mechanical and oxygen support to facilitate gas exchange; Position for maximum ventilatory efficiency; Teach/reinforce use of incentive spirometer 10 times per hour while awake, cough and deep breath as needed; Plan activities to conserve energy: plan rest periods; Increase activity as tolerated/progressive mobility; Consult/collaborate with Respiratory Therapy  Note:   Hx of Seasonal Asthma, noted to have wheezing as per assessment , denies any sob, RA sats > 94%,refused  O2 at this time.Nebs treatment ordered.

## 2018-05-04 NOTE — Plan of Care (Signed)
Admission Notes: Patient received from ED via stretcher ,patient ambulatory w/ steady gait ,no family noted, maintain on Insulin drip.Will continue to monitor vital signs  & complete History & assessment.Dr Leron Croak came to see patient & w/ new orders.

## 2018-05-05 DIAGNOSIS — E1010 Type 1 diabetes mellitus with ketoacidosis without coma: Secondary | ICD-10-CM

## 2018-05-05 LAB — BASIC METABOLIC PANEL
Anion Gap: 8 (ref 5.0–15.0)
BUN: 9 mg/dL (ref 9–28)
CO2: 24 mEq/L (ref 22–29)
Calcium: 8.6 mg/dL (ref 8.5–10.5)
Chloride: 104 mEq/L (ref 100–111)
Creatinine: 0.9 mg/dL (ref 0.7–1.3)
Glucose: 254 mg/dL — ABNORMAL HIGH (ref 70–100)
Potassium: 3.4 mEq/L — ABNORMAL LOW (ref 3.5–5.1)
Sodium: 136 mEq/L (ref 136–145)

## 2018-05-05 LAB — PHOSPHORUS: Phosphorus: 3.6 mg/dL (ref 2.3–4.7)

## 2018-05-05 LAB — GLUCOSE WHOLE BLOOD - POCT
Whole Blood Glucose POCT: 191 mg/dL — ABNORMAL HIGH (ref 70–100)
Whole Blood Glucose POCT: 337 mg/dL — ABNORMAL HIGH (ref 70–100)

## 2018-05-05 LAB — MAGNESIUM: Magnesium: 1.7 mg/dL (ref 1.6–2.6)

## 2018-05-05 LAB — MRSA CULTURE
Culture MRSA Surveillance: NEGATIVE
Culture MRSA Surveillance: NEGATIVE

## 2018-05-05 LAB — GFR: EGFR: >60

## 2018-05-05 MED ORDER — GLUCAGON (RDNA) 1 MG IJ KIT
1.00 mg | PACK | Freq: Once | INTRAMUSCULAR | 0 refills | Status: AC | PRN
Start: 2018-05-05 — End: ?

## 2018-05-05 MED ORDER — FLUTICASONE FUROATE-VILANTEROL 200-25 MCG/INH IN AEPB
1.00 | INHALATION_SPRAY | Freq: Every day | RESPIRATORY_TRACT | 0 refills | Status: DC
Start: 2018-05-05 — End: 2018-07-03

## 2018-05-05 MED ORDER — ALBUTEROL SULFATE HFA 108 (90 BASE) MCG/ACT IN AERS
2.00 | INHALATION_SPRAY | Freq: Four times a day (QID) | RESPIRATORY_TRACT | 0 refills | Status: DC | PRN
Start: 2018-05-05 — End: 2018-07-03

## 2018-05-05 MED ORDER — INSULIN GLARGINE 100 UNIT/ML SC SOLN
25.00 [IU] | Freq: Two times a day (BID) | SUBCUTANEOUS | 0 refills | Status: DC
Start: 2018-05-05 — End: 2018-07-03

## 2018-05-05 NOTE — Discharge Summary (Signed)
MEDICINE DISCHARGE SUMMARY    Date Time: 05/05/18 2:37 PM  Patient Name: [name] [name], [name] [name].  Attending Physician: Wiliam Ke  Primary Care Physician: Marisa Sprinkles, MD    Date of Admission: 05/04/2018  Date of Discharge: 05/05/2018    Discharge Diagnoses:     Principal Diagnosis (Diagnosis after study, that is chiefly responsible for admission):     DKA present on admission now resolved  Type 1 diabetes  Persistent uncontrolled asthma with acute exacerbation    Disposition:    Disposition: home with family    Pending Results, Recommendations & Instructions to providers after discharge:     1. Micro / Labs / Path pending:   Unresulted Labs     None        2. Wound Care Instructions:   3. Date of completion for antibiotics or other medications:   4. Other:   5. Code Status:Full    Procedures/Radiology performed:   Radiology: all results from this admission  No results found.  Surgery: all results from this admission  * No surgery found [facility] Course:     Reason for admission/ HPI: Patient is a 22 year old male history of type 1 diabetes since age 59 who presented with nausea and increased thirst and found to be in DKA.     Hospital Course:     Patient is a 22 year old male with 10-year history of type 1 diabetes for which she is typically controlled with Lantus 38 units nightly and NovoLog sliding scale with carbohydrate counting at home.  He had been taking his insulin as prescribed but admits that he ate large amount of carbohydrates including pizza and subsequently developed polyuria and increased thirst.  He came to the emergency department for further evaluation and was found to be in DKA.  Patient was started on IV fluids and insulin drip.  He received a total of 16 units through the insulin infusion at which point his anion gap had closed.  He was transitioned to Lantus and subcutaneous insulin.  Blood sugars have been well controlled since that time and he was transferred out of the ICU.   During hospitalization he received 30 units of Lantus twice a day which was significantly increased compared to his home dose.  Will increase from his home dose of 28 units nightly to 25 units twice daily and have patient continue with sliding scale per carbohydrate counting.  Patient to follow-up with PCP and endocrinology.    Patient with history of asthma although he is not on albuterol at home.  He was found to have wheezing on admission and started on Breo as well as PRN albuterol.  At this time his symptoms have resolved.  Recommend that he continue with Brio and PRN albuterol and follow-up with pulmonology as an outpatient.    Discharge condition: stable    Discharge Day Exam:  Today:  BP 109/52   Pulse 80   Temp 98.6 F (37 C) (Oral)   Resp 18   Ht 1.702 m (5\' 7" )   Wt 61.5 kg (135 lb 9.6 oz)   SpO2 99%   BMI 21.24 kg/m   Ranges for the last 24 hours:  Temp:  [98.1 F (36.7 C)-98.6 F (37 C)] 98.6 F (37 C)  Heart Rate:  [76-98] 80  Resp Rate:  [18-22] 18  BP: (109-124)/(52-84) 109/52  Body mass index is 21.24 kg/m.      Intake/Output Summary (Last 24 hours) at 05/05/2018 1437  Last data filed at 05/05/2018 1400  Gross per 24 hour   Intake 3573.09 ml   Output 1050 ml   Net 2523.09 ml    General: awake, alert ,in no acute distress  Cardiovascular: regular rate and rhythm, no murmurs, rubs or gallops  Lungs: clear to auscultation bilaterally, no additional sounds  Abdomen: soft, non-tender, non-distended; normoactive bowel sounds  Extremities: no edema  Neurological: Alert and oriented X 3, moves all extremities.         Wounds/decutibus ulcers/stage:    Consultations:   Treatment Team:   Attending Provider: Wiliam Ke, DO  Consulting Physician: Wiliam Ke, DO  Recent Labs - Last 2:       Recent Labs   Lab 05/04/18  0947   WBC 6.58   Hgb 14.4   Hematocrit 44.5   Platelets 245         Recent Labs   Lab 05/05/18  0708 05/04/18  1427 05/04/18  0947   Sodium 136 139 130*    Potassium 3.4* 4.0 5.1   Chloride 104 106 92*   CO2 24 22 16*   BUN 9 19 23    Creatinine 0.9 1.1 1.7*   EGFR >60.0 >60.0 >60.0   Glucose 254* 234* 723*   Calcium 8.6 9.3 10.2     Recent Labs   Lab 05/04/18  0947   Alkaline Phosphatase 192*   Bilirubin, Total 0.8   Protein, Total 6.9   Albumin 4.3   ALT 19   AST (SGOT) 23            Invalid input(s): FREET4         Microbiology Results     Procedure Component Value Units Date/Time    MRSA culture - Nares [[id number]] Collected:  05/04/18 1247    Specimen:  Body Fluid from Nares Updated:  05/05/18 1355     Culture MRSA Surveillance Negative for Methicillin Resistant Staph aureus    MRSA culture - Throat [[id number]] Collected:  05/04/18 1247    Specimen:  Body Fluid from Throat Updated:  05/05/18 1355     Culture MRSA Surveillance Negative for Methicillin Resistant Staph aureus          Discharge Instructions & Follow Up Plan for Patient:   Discharge Diet: Diet consistent carbohydrate  Supervise For Meals Frequency: All meals  Activity/Weight Bearing Status: as tolerated   Patient was instructed to follow up with:     Follow-up Information     Pcp, None, MD .                 Complete instructions and follow up are in the patient's After Visit Summary    Minutes spent coordinating discharge and reviewing discharge plan:33 minutes    Discharge Medications:        Medication List      START taking these medications    albuterol 108 (90 Base) MCG/ACT inhaler  Commonly known as:  PROVENTIL HFA;VENTOLIN HFA  Inhale 2 puffs into the lungs every 6 (six) hours as needed for Wheezing or Shortness of Breath     fluticasone furoate-vilanterol 200-25 MCG/INH Aepb  Commonly known as:  BREO ELLIPTA  Inhale 1 puff into the lungs daily     glucagon 1 MG injection  Commonly known as:  GLUCAGON EMERGENCY  Inject 1 mg into the skin once as needed (hypoglycemia less than 50)        CHANGE how you take these medications  insulin glargine 100 UNIT/ML injection  Commonly known as:   LANTUS  Inject 25 Units into the skin every 12 (twelve) hours  What changed:     how much to take   when to take this        CONTINUE taking these medications    ACCU-CHEK FASTCLIX LANCETS Misc  Check blood sugar 7 times daily, as directed by MD.     glucose blood test strip  Commonly known as:  ACCU-CHEK AVIVA PLUS  Check blood sugar 7 times daily, as directed by MD.     insulin lispro 100 UNIT/ML injection  Commonly known as:  HumaLOG  Administer via carb count protocal        STOP taking these medications    insulin aspart 100 UNIT/ML injection  Commonly known as:  NovoLOG           Where to Get Your Medications      You can get these medications from any pharmacy    Bring a paper prescription for each of these medications   albuterol 108 (90 Base) MCG/ACT inhaler   fluticasone furoate-vilanterol 200-25 MCG/INH Aepb   glucagon 1 MG injection   insulin glargine 100 UNIT/ML injection         Immunizations provided:   There is no immunization history on file for this patient.    This note was generated by the Epic EMR system/ Dragon speech recognition and may contain inherent errors or omissions not intended by the user. Grammatical errors, random word insertions, deletions and pronoun errors  are occasional consequences of this technology due to software limitations. Not all errors are caught or corrected. If there are questions or concerns about the content of this note or information contained within the body of this dictation they should be addressed directly with the author for clarification.    Signed by: Wiliam Ke, MD  [facility]  Department of Medicine  CC: Pcp, None, MD

## 2018-05-05 NOTE — Progress Notes (Signed)
Pt d/cd to home this pm with Rx. Left [facility] without any distress.

## 2018-05-05 NOTE — Progress Notes (Signed)
Pt alert and oriented x  4, follows commands and answers appropriately  to question. Bs checked this shift  And coverages given  together with  Long acting insulin with return demo. Md in to see him at am and discussed d/c to home this pm after review of his medicine with RX. Orders received at this time and pt notified. Pt verbalized he will call [employer] for transportation . Denies any discomfort.

## 2018-05-05 NOTE — Progress Notes (Signed)
Patient arrived at the unit from CCU around 2300 accompanied by hospital staff. Alert and oriented x4. Denied pain. After eating snack patient slept for the rest of the shift. No distress noted. Call light within reach.

## 2018-05-05 NOTE — Discharge Instr - AVS First Page (Signed)
Reason for your Hospital Admission:  DKA      Instructions for after your discharge:  Lantus dose increased to 25 units twice a day.  Continue with NovoLog sliding scale based on carbohydrate counting.  Note that during hospitalization he required an average of between 3 and 5 units of NovoLog with meals.  Continue with carbohydrate controlled diet and follow-up with PCP and endocrinology.  For low blood sugars follow hypoglycemic protocol.  For blood sugar not improving with juice or glucose tablets may use an emergent glucagon kit.

## 2018-05-05 NOTE — Plan of Care (Signed)
Problem: Safety  Goal: Patient will be free from injury during hospitalization  Outcome: Progressing  Flowsheets (Taken 05/04/2018 1421 by Doran Heater, RN)  Patient will be free from injury during hospitalization : Assess patient's risk for falls and implement fall prevention plan of care per policy;Ensure appropriate safety devices are available at the bedside;Assess for patients risk for elopement and implement Elopement Risk Plan per policy;Include patient/ family/ care giver in decisions related to safety;Provide and maintain safe environment;Use appropriate transfer methods;Hourly rounding  Goal: Patient will be free from infection during hospitalization  Outcome: Progressing  Flowsheets (Taken 05/04/2018 1421 by Doran Heater, RN)  Free from Infection during hospitalization: Assess and monitor for signs and symptoms of infection;Monitor all insertion sites (i.e. indwelling lines, tubes, urinary catheters, and drains);Monitor lab/diagnostic results;Encourage patient and family to use good hand hygiene technique     Problem: Fluid and Electrolyte Imbalance/ Endocrine  Goal: Fluid and electrolyte balance are achieved/maintained  Outcome: Progressing  Flowsheets (Taken 05/04/2018 1421 by Doran Heater, RN)  Fluid and electrolyte balance are achieved/maintained: Monitor intake and output every shift;Monitor/assess lab values and report abnormal values;Provide adequate hydration;Assess and reassess fluid and electrolyte status;Monitor daily weight;Assess for confusion/personality changes;Observe for seizure activity and initiate seizure precautions if indicated;Observe for cardiac arrhythmias;Monitor for muscle weakness  Goal: Adequate hydration  Outcome: Progressing  Flowsheets (Taken 05/04/2018 1421 by Doran Heater, RN)  Adequate hydration: Assess mucus membranes, skin color, turgor, perfusion and presence of edema;Assess for peripheral, sacral, periorbital and abdominal edema;Monitor  and assess vital signs and perfusion     Problem: Diabetes: Glucose Imbalance  Goal: Blood glucose stable at established goal  Outcome: Progressing  Flowsheets (Taken 05/04/2018 1421 by Doran Heater, RN)  Blood glucose stable at established goal : Monitor lab values;Monitor intake and output.  Notify LIP if urine output is < 30 mL/hour.;Follow fluid restrictions/IV/PO parameters;Assess for hypoglycemia /hyperglycemia;Monitor/assess vital signs;Include patient/family in decisions related to nutrition/dietary selections;Coordinate medication administration with meals, as indicated;Ensure adequate hydration;Ensure appropriate diet and assess tolerance;Ensure patient/family has adequate teaching materials;Ensure appropriate consults are obtained (Nutrition, Diabetes Education, and Case Management)     Problem: Inadequate Gas Exchange  Goal: Adequate oxygenation and improved ventilation  Outcome: Progressing  Flowsheets (Taken 05/04/2018 1421 by Doran Heater, RN)  Adequate oxygenation and improved ventilation: Assess lung sounds;Monitor SpO2 and treat as needed;Provide mechanical and oxygen support to facilitate gas exchange;Position for maximum ventilatory efficiency;Teach/reinforce use of incentive spirometer 10 times per hour while awake, cough and deep breath as needed;Plan activities to conserve energy: plan rest periods;Increase activity as tolerated/progressive mobility;Consult/collaborate with Respiratory Therapy

## 2018-05-05 NOTE — Progress Notes (Signed)
05/05/18.  "22 y.o. male presents with nausea, vomiting, elevated blood sugar..." found to be in DKA, required insulin gtt for several hours but weaned of and transferred from ICU to medical on 08/04/17. DCP: home with no needs.

## 2018-05-05 NOTE — UM Notes (Signed)
HPI  04/04/18    "22 y.o. male presents with nausea, vomiting, elevated blood sugar..."      98.3 F (36.8 C) Oral 99 98 % -- 18 126/72 -- -- -- -- 8       GLUCOSE: 723, CRT: 1.7, Sodium: 130, CO2: 16, Anion gap: 22; ketones: 80    ER TX   Zofran 4mg  IV  Insulin regular 5 units  IVF 1 liter x2 doses  Insulin gtt 5units/hr    Admitted Inpatient to ICU on Insulin gtt    "  Critical Care Issues:     1. DKA (unclear trigger but I suspect potential lack of compliance with insulin).  2. Type 1 diabetes (dx at age of 7).  3. Smoker.  4. Asthma with wheezes on exam (on admission).  5. Dehydration due to above.   6. AKI due to dehydration/pre-renal status.   7. Pseudohyponatremia.   8. AGMA due to DKA.     Main ICU Plans:     Neuro:   - Stable, monitor.       Respiratory: Bronchospasm.  Wheezing on exam.  Seasonal asthma reported by patient.  Frequent nocturnal symptoms suggestive of uncontrolled asthma.  Hx of smoking.  - Maintain O2 sat>94%  - duoNEBs q6.  - Start LABA/ICS (Breo Ellipta).   Needs outpatient follow up with step down approach to asthma management.   - Smoking cessation.       Cardiovascular:   - MAP goal>65  - Stable hemodynamics.       GI:   - Bowel regimen: none.   - Nutrition: NPO for now.   - GI ppx: n/a.       Renal: AKI on admission due to hypovolemia/dehydration in setting of DKA.   - Monitor UOP, strict I/Os, trend BUN/Cr, daily weights  - Monitor and correct lytes prn  - Maintenance IVF: as per DKA protocol..."         Admit to Inpatient (Order [id number])   Admission   Date: 05/04/2018 at 1112             05/05/18   Downgrade to medical floor   Off insulin gtt and on sliding scale coverage   VS relatively stable

## 2018-05-10 ENCOUNTER — Emergency Department
Admission: EM | Admit: 2018-05-10 | Discharge: 2018-05-10 | Disposition: A | Discharge status: Home / Self Care | Payer: Medicaid HMO | Attending: Emergency Medicine | Admitting: Emergency Medicine

## 2018-05-10 DIAGNOSIS — S61209A Unspecified open wound of unspecified finger without damage to nail, initial encounter: Secondary | ICD-10-CM

## 2018-05-10 DIAGNOSIS — S61211A Laceration without foreign body of left index finger without damage to nail, initial encounter: Secondary | ICD-10-CM | POA: Insufficient documentation

## 2018-05-10 DIAGNOSIS — Z7951 Long term (current) use of inhaled steroids: Secondary | ICD-10-CM | POA: Insufficient documentation

## 2018-05-10 DIAGNOSIS — W268XXA Contact with other sharp object(s), not elsewhere classified, initial encounter: Secondary | ICD-10-CM | POA: Insufficient documentation

## 2018-05-10 DIAGNOSIS — S66321A Laceration of extensor muscle, fascia and tendon of left index finger at wrist and hand level, initial encounter: Secondary | ICD-10-CM | POA: Insufficient documentation

## 2018-05-10 DIAGNOSIS — J45909 Unspecified asthma, uncomplicated: Secondary | ICD-10-CM | POA: Insufficient documentation

## 2018-05-10 DIAGNOSIS — Y990 Civilian activity done for income or pay: Secondary | ICD-10-CM | POA: Insufficient documentation

## 2018-05-10 DIAGNOSIS — E119 Type 2 diabetes mellitus without complications: Secondary | ICD-10-CM | POA: Insufficient documentation

## 2018-05-10 DIAGNOSIS — Z794 Long term (current) use of insulin: Secondary | ICD-10-CM | POA: Insufficient documentation

## 2018-05-10 DIAGNOSIS — F17210 Nicotine dependence, cigarettes, uncomplicated: Secondary | ICD-10-CM | POA: Insufficient documentation

## 2018-05-10 MED ORDER — LIDOCAINE HCL 1 % IJ SOLN
10.00 mL | Freq: Once | INTRAMUSCULAR | Status: AC
Start: 2018-05-10 — End: 2018-05-10
  Administered 2018-05-10: 16:00:00 10 mL via INTRADERMAL
  Filled 2018-05-10: qty 20

## 2018-05-10 MED ORDER — DOXYCYCLINE MONOHYDRATE 50 MG PO CAPS
100.00 mg | ORAL_CAPSULE | Freq: Once | ORAL | Status: AC
Start: 2018-05-10 — End: 2018-05-10
  Administered 2018-05-10: 16:00:00 100 mg via ORAL
  Filled 2018-05-10: qty 2

## 2018-05-10 MED ORDER — DOXYCYCLINE HYCLATE 100 MG PO TABS
100.00 mg | ORAL_TABLET | Freq: Two times a day (BID) | ORAL | 0 refills | Status: AC
Start: 2018-05-10 — End: 2018-05-17

## 2018-05-10 NOTE — EDIE (Addendum)
COLLECTIVE?NOTIFICATION?05/10/2018 14:54?BLADES, Aubery?MRN: [id number]    [place] [facility]'s patient encounter information:   MRN:[id number]  Account [id number]  Billing Account [id number]      Criteria Met      5 ED Visits in 12 Months    Security and Safety  No recent Security Events currently on file    ED Care Guidelines  There are currently no ED Care Guidelines for this patient. Please check your facility's medical records system.      Prescription Monitoring Program  PDMP query found no report.  Narx Score not available at this time.      E.D. Visit Count (12 mo.)  Facility Visits   [place] [facility] 3   [facility] 2   Total 5   Note: Visits indicate total known visits.      Recent Emergency Department Visit Summary  Date Facility [facility] Type Diagnoses or Chief Complaint   May 10, 2018 [place] [facility] [place] [facility]      medic-      May 04, 2018 [place] [facility] [place] [facility]      nausea, emesis      Nausea      Hyperglycemia      1 Type 1 diabetes mellitus with ketoacidosis without coma      Apr 19, 2018 [place] [facility] [place] [facility]      med refill      Medication Refill      Hyperglycemia, unspecified      Nov 24, 2017 [facility] [place] [place] [facility]     Sep 06, 2017 [facility] [place] [place] [facility]     Jul 25, 2017 [place] [facility] [employer]  Urgent Care         Recent Inpatient Visit Summary  Date Facility [facility] Type Diagnoses or Chief Complaint   May 04, 2018 [place] [facility]      1 Type 1 diabetes mellitus with ketoacidosis without coma          Care Team  There is not a care team on record at this time.   Collective Portal  This patient has registered at the [facility] Emergency Department   For more information visit: [email or web address]      PLEASE NOTE:    1.   Any care recommendations and other clinical information are provided as guidelines or for historical purposes only, and providers should exercise their own clinical judgment when providing care.    2.   You may only use this information for purposes of treatment, payment or health care operations activities, and subject to the limitations of applicable Collective Policies.    3.   You should consult directly with the organization that provided a care guideline or other clinical history with any questions about additional information or accuracy or completeness of information provided.    ? 2019 [place], [employer] - [email or web address]

## 2018-05-10 NOTE — Discharge Instructions (Signed)
Laceration, Absorbable Sutures     You have been treated for a laceration (cut).     Sutures (stitches) were used. They are absorbable. The body will break down and absorb them over time. They often do not need to be taken out.      The sutures have to stay in place long enough for the wound to heal. For most wounds this is about a week. The wetter the sutures get, the faster they dissolve. Sometimes, the sutures don't dissolve all the way. Then a doctor has to take them out. See your doctor if the sutures are still there after 7 to 10 days.      Use the following wound care instructions:  · Keep the wound clean and dry for the next 24 hours. You can wash the wound gently with soap and water.  · DO NOT allow your wound to soak in water (don’t do the dishes or go swimming, for example). You can shower, but do not rub your stitches too hard. Let the wound dry before putting another bandage on.  · Take off old dressings every day. Then put on a clean, dry dressing.  · If the bandage sticks to the wound, slightly moisten it with water. This way, it can come off more easily.  · Allow the area to dry completely before putting on a new bandage.  · Unless you receive instructions not to do so, you can place a thin layer of antibiotic ointment over the wound. You can buy Polysporin®, Bacitracin®, or Neosporin® at the store. Neosporin® can sometimes cause irritation to your skin. If this happens, stop using it and switch to another topical (surface) antibiotic.  · If needed, put a clean, dry bandage over the wound to protect it.     Keep the affected area elevated (lifted) for the next 24 hours. This will decrease swelling and pain. You may also want to put ice on the area. By applying ice to the affected area, swelling and pain can be reduced. Place some ice cubes in a re-sealable (Ziploc®) bag and add some water. Put a thin washcloth between the bag and the skin. Apply the ice bag to the area for at least 20 minutes. Do  this at least 4 times per day. It is OK to use the ice more frequently and for longer periods of time. DO NOT APPLY ICE DIRECTLY TO THE SKIN!     If you had a local anesthetic, it will wear off in about 2 hours. Until then, be careful not to hurt yourself because of having less feeling in the area.     YOU SHOULD SEEK MEDICAL ATTENTION IMMEDIATELY, EITHER HERE OR AT THE NEAREST EMERGENCY DEPARTMENT, IF ANY OF THE FOLLOWING OCCURS:  · You see redness or swelling.  · There are red streaks going up from the injured area.  · The wound smells bad or has a lot of drainage.  · You have fever (temperature higher than 100.4ºF / 38ºC), chills, worse pain and / or swelling.               Laceration, Complex Hand     You have been seen for a complex hand laceration (cut).     You have been given a SPLINT. This is to keep your hand still. The laceration repair can be damaged if your hand moves. Unless your doctor says not to, always keep the splint on at all times. Do so until you follow up with the doctor.      It is   important that the splint is not too tight. Use the following SPLINT CARE instructions. Do the following many times throughout the day:   Check capillary refill (circulation) in the fingernails. To do this, press on the nail, and then let go. The nail should turn white while you press on it. When you let go, the nail should turn pink within 2 seconds.   Watch to see if the area beyond the splint gets swollen. Make sure the hand and fingers are not very cold. They should also not be pale or numb.   If the splint is too tight, loosen the wrap while holding the splint in place. You can also have the splint adjusted. To do so, come back here or to the nearest emergency department.    Your wound was repaired with sutures (stitches). Follow the instructions you got to care for your wound.    DO NOT get your hand wet. You may shower or bathe if you can keep the arm dry. You can wrap it in a plastic bag, for  example.    Keep the injured area elevated (lifted) for the next 24 hours. This will decrease swelling and pain. You may also want to put ice on the area. Place some ice cubes in a re-sealable (Ziploc) bag and add some water. Put a thin washcloth between the bag and the skin or splint. Apply the ice bag to the area for at least 20 minutes. Do this at least 4 times per day. It is okay to do this more often than directed. You can also do it for longer than directed. NEVER APPLY ICE DIRECTLY TO THE SKIN.    Not all lacerations (cuts) will need antibiotics. Your doctor may have decided that you need antibiotics to prevent an infection. Be sure to fill the prescription and take all medicines as directed.    If your doctor gave you a prescription for pain medicine, fill the prescription and use the medicine as directed.    YOU SHOULD SEEK MEDICAL ATTENTION IMMEDIATELY, EITHER HERE OR AT THE NEAREST EMERGENCY DEPARTMENT, IF ANY OF THE FOLLOWING OCCURS:   You see redness or swelling.   There are red streaks or there is redness around the wound.   The wound smells bad or has a lot of drainage.   You have fever (temperature higher than 100.9F / 38C), chills, worse pain and / or swelling.               Laceration, General Wound Care    Use the following wound care instructions for your laceration (cut):   Keep the wound clean and dry for the next 24 hours. Avoid excessive moisture. You can wash the wound gently with soap and water. Then apply a dry bandage.   DO NOT allow your wound to soak in water (don't do the dishes or go swimming, for example). You can shower, but do not rub your stitches too hard. Let the wound dry before putting another bandage on.   Take off old dressings every day. Then put on a clean, dry dressing.   If the dressing sticks to the wound, slightly moisten it with water. This way, it can come off more easily.   To help remove a scab, cleanse the area with a mixture of half hydrogen  peroxide and half water. This will also help [place] to take out the sutures when they are ready to be taken out.   Let the area dry thoroughly.  Unless you receive instructions not to do so, you can place a thin layer of antibiotic ointment over the wound. You can buy Polysporin, Bacitracin, or Neosporin at the store. Neosporin can sometimes cause irritation to your skin. If this happens, stop using it and switch to another topical (surface) antibiotic.   If needed, put a clean, dry bandage over the wound to protect it.    Keep the injured area elevated (lifted) for the next 24 hours. This will decrease swelling and pain. You may also want to put ice on the area. Place some ice cubes in a re-sealable (Ziploc) bag and add some water. Put a thin washcloth between the bag and the skin. Apply the ice bag to the area for at least 20 minutes. Do this at least 4 times per day. It is okay to do this more often than directed. You can also do it for longer than directed. NEVER APPLY ICE DIRECTLY TO THE SKIN.    If you had a local anesthetic, it will wear off in about 2 hours. Until then, be careful not to hurt yourself because of having less feeling in the area.    Not all lacerations (cuts) will need antibiotics. Your doctor may have decided that you need antibiotics to prevent an infection. Be sure to fill the prescription and take all medicines as directed.    If your doctor gave you a prescription for pain medicine, fill the prescription and use the medicine as directed.    YOU SHOULD SEEK MEDICAL ATTENTION IMMEDIATELY, EITHER HERE OR AT THE NEAREST EMERGENCY DEPARTMENT, IF ANY OF THE FOLLOWING OCCURS:   You see redness or swelling.   There are red streaks or there is redness around the wound.   The wound smells bad or has a lot of drainage.   You have fever (temperature higher than 100.55F or 38C), chills, worse pain and / or swelling.    Laceration, Sutures    You have been treated for a laceration  (cut).    Follow up with your doctor OR come back here OR go to the nearest Emergency Department to have your sutures (stitches) taken out. Sutures should be taken out in:   14 days.    Use the following wound care instructions:   Keep the wound clean and dry for the next 24 hours. You can wash the wound gently with soap and water.   DO NOT allow your wound to soak in water (don't do the dishes or go swimming, for example). You can shower, but do not rub your stitches too hard. Let the wound dry before putting another bandage on.   Take off old dressings every day. Then put on a clean, dry dressing.   If the bandage sticks to the wound, slightly moisten it with water. This way, it can come off more easily.   You can wash the wound gently with soap and water. To help remove a scab, cleanse the area with a mixture of half hydrogen peroxide and half water. This will also help [place] to take out the sutures later.   Allow the area to dry completely before putting on a new bandage.   Unless you receive instructions not to do so, you can place a thin layer of antibiotic ointment over the wound. You can buy Polysporin, Bacitracin, or Neosporin at the store. Neosporin can sometimes cause irritation to your skin. If this happens, stop using it and switch to another topical (surface) antibiotic.  If needed, put a clean, dry bandage over the wound to protect it.    Keep the affected area elevated (lifted) for the next 24 hours. This will decrease swelling and pain. You may also want to put ice on the area. By applying ice to the affected area, swelling and pain can be reduced. Place some ice cubes in a re-sealable (Ziploc) bag and add some water. Put a thin washcloth between the bag and the skin. Apply the ice bag to the area for at least 20 minutes. Do this at least 4 times per day. It is OK to use the ice more frequently and for longer periods of time. DO NOT APPLY ICE DIRECTLY TO THE SKIN!    If you had a  local anesthetic, it will wear off in about 2 hours. Until then, be careful not to hurt yourself because of having less feeling in the area.    YOU SHOULD SEEK MEDICAL ATTENTION IMMEDIATELY, EITHER HERE OR AT THE NEAREST EMERGENCY DEPARTMENT, IF ANY OF THE FOLLOWING OCCURS:   You see redness or swelling.   There are red streaks going up from the injured area.   The wound smells bad or has a lot of drainage.   You have fever (temperature higher than 100.79F / 38C), chills, worse pain and / or swelling.

## 2018-05-10 NOTE — ED Provider Notes (Signed)
EMERGENCY DEPARTMENT NOTE    Physician/Midlevel provider first contact with patient: 05/10/18 1505         HISTORY OF PRESENT ILLNESS   Historian:patient   Translator Used: No    Chief Complaint: Laceration     Mechanism of Injury:       22 y.o. male presents to the ED with a complaint of left index finger laceration. This occurred at work with a box cutter just prior to arrival. No numbness or tingling. Pain with ROM only. Tetanus shot is up to date as of 5 years ago. Pt is a diabetic.     1. Location of symptoms: left index dorsal aspect of the MCP joint  2. Onset of symptoms: just prior to arrival.   3. What was patient doing when symptoms started (Context): see above  4. Severity: moderate  5. Timing: ongoing  6. Activities that worsen symptoms: moving the affected finger.   7. Activities that improve symptoms: rest  8. Quality: "pain" burning  9. Radiation of symptoms: no  10. Associated signs and Symptoms: see above  11. Are symptoms worsening? no  MEDICAL HISTORY     Past Medical History:  Past Medical History:   Diagnosis Date   . Diabetes mellitus    . Seasonal asthma        Past Surgical History:  History reviewed. No pertinent surgical history.    Social History:  Social History     Socioeconomic History   . Marital status: Single     Spouse name: Not on file   . Number of children: Not on file   . Years of education: Not on file   . Highest education level: Not on file   Occupational History   . Not on file   Social Needs   . Financial resource strain: Not on file   . Food insecurity:     Worry: Not on file     Inability: Not on file   . Transportation needs:     Medical: Not on file     Non-medical: Not on file   Tobacco Use   . Smoking status: Current Every Day Smoker     Packs/day: 0.25     Types: Cigarettes   . Smokeless tobacco: Never Used   Substance and Sexual Activity   . Alcohol use: Yes     Comment: socially    . Drug use: Yes     Comment: marijuana a couple times a week    . Sexual activity:  Not on file   Lifestyle   . Physical activity:     Days per week: Not on file     Minutes per session: Not on file   . Stress: Not on file   Relationships   . Social connections:     Talks on phone: Not on file     Gets together: Not on file     Attends religious service: Not on file     Active member of club or organization: Not on file     Attends meetings of clubs or organizations: Not on file     Relationship status: Not on file   . Intimate partner violence:     Fear of current or ex partner: Not on file     Emotionally abused: Not on file     Physically abused: Not on file     Forced sexual activity: Not on file   Other Topics Concern   . Not  on file   Social History Narrative   . Not on file       Family History:  History reviewed. No pertinent family history.    Outpatient Medication:  Previous Medications    ACCU-CHEK FASTCLIX LANCETS MISC    Check blood sugar 7 times daily, as directed by MD.    ALBUTEROL (PROVENTIL HFA;VENTOLIN HFA) 108 (90 BASE) MCG/ACT INHALER    Inhale 2 puffs into the lungs every 6 (six) hours as needed for Wheezing or Shortness of Breath    FLUTICASONE FUROATE-VILANTEROL (BREO ELLIPTA) 200-25 MCG/INH AEROSOL POWDER, BREATH ACTIVTIVATEDE    Inhale 1 puff into the lungs daily    GLUCAGON (GLUCAGON EMERGENCY) 1 MG INJECTION    Inject 1 mg into the skin once as needed (hypoglycemia less than 50)    GLUCOSE BLOOD (ACCU-CHEK AVIVA PLUS) TEST STRIP    Check blood sugar 7 times daily, as directed by MD.    INSULIN GLARGINE (LANTUS) 100 UNIT/ML INJECTION    Inject 25 Units into the skin every 12 (twelve) hours    INSULIN LISPRO (HUMALOG) 100 UNIT/ML INJECTION    Administer via carb count protocal         REVIEW OF SYSTEMS   Review of Systems   Constitutional: Negative for fever.   HENT: Negative for sore throat.    Eyes: Negative for pain.   Respiratory: Negative for cough.    Cardiovascular: Negative for chest pain.   Musculoskeletal: Positive for joint pain (Left index finger).   Skin:         +laceration   Neurological: Negative for tingling, tremors, sensory change, speech change, focal weakness, seizures and loss of consciousness.   Psychiatric/Behavioral: Negative for memory loss.   All other systems reviewed and are negative.             PHYSICAL EXAM     ED Triage Vitals [05/10/18 1517]   Enc Vitals Group      BP 133/82      Heart Rate 98      Resp Rate 18      Temp 98.3 F (36.8 C)      Temp Source Oral      SpO2 100 %      Weight       Height       Head Circumference       Peak Flow       Pain Score 0      Pain Loc       Pain Edu?       Excl. in GC?      Nursing note and vitals reviewed.  Constitutional:  Well developed, well nourished. alert and awake  Head:  Atraumatic. Normocephalic.    Eyes:  PERRL. EOMI. No scleral icterus  ENT:  Mucous membranes are moist and intact. Oropharynx is clear.  External ears normal. Patent airway.  Neck:  Supple. Full ROM.    Cardiovascular:  Regular rate. Regular rhythm. No murmurs, rubs, or gallops.  Pulmonary/Chest:  No evidence of respiratory distress. Clear to auscultation bilaterally.  No wheezing, rales or rhonchi.   Abdominal:  Soft and non-distended. No tenderness. No rebound, guarding, or rigidity.  Back:  Full ROM. Nontender. No Costovertebral Tenderness.  Extremities:  No edema. No cyanosis.  1 similar laceration to the dorsal aspect of the left MCP joint.  There is a approximately 60% tendon laceration of the extensor tendon.  Patient does have full range of motion.  He  has full extension and FDS and FDP are intact in the entire left hand.  There is pain with range of motion.  Cap refill is less than 3 seconds.  2+ radial pulse in the left hand.  Patient is right-handed.  No foreign body identified laceration  Skin:  Skin is warm and dry.  No diaphoresis.  Laceration as above  Neurological:  Alert, awake, and appropriate. Normal speech. Motor grossly normal. Cranial Nerves grossly intact by observation.   Psychiatric:  Good eye contact. Normal  interaction, affect, and behavior.          MEDICAL DECISION MAKING     DISCUSSION    Patient presents with left dorsum of the hand laceration.  There is a tendon injury.  I discussed case with Dr. Tana Felts who recommended repairing this portion with Vicryl.  He will see the patient in the office next week.  Patient was cleaned and draped in sterile fashion.  Vicryl was used to repair the tendon laceration.  Ethilon was used to repair the skin.  Patient was splinted in full extension at the request of the hand surgeon.  Patient tolerated the procedure very well.  He has full extension and normal cap refill after the procedure.  Patient is nontoxic and well-appearing.  He has a small superficial abrasion to his left palm.  This does not require any wound care.  Patient will be placed on antibiotics.  Given his allergies patient was placed on doxycycline.  Patient also was advised to take Tylenol Motrin for the pain.  Patient is currently nontoxic and well-appearing.  He agrees with disposition and plan.  Stable for outpatient management symptoms.  Patient was given strict return precautions and follow-up instructions.  I currently do not feel that it is necessary to image the patient.  There is no evidence of fracture on my examination.  Patient does has a tendon laceration.    Vital Signs: Reviewed the patient?s vital signs.   Nursing Notes: Reviewed and utilized available nursing notes.  Medical Records Reviewed: Reviewed available past medical records.  Counseling: The emergency provider has spoken with the patient and discussed today?s findings, in addition to providing specific details for the plan of care.  Questions are answered and there is agreement with the plan.          RADIOLOGY IMAGING STUDIES      No orders to display           PULSE OXIMETRY    Oxygen Saturation by Pulse Oximetry: 100%  Interventions: none  Interpretation: Normal    EMERGENCY DEPT. MEDICATIONS      ED Medication Orders (From admission,  onward)    Start Ordered     Status Ordering Provider    05/10/18 1617 05/10/18 1616  doxycycline (MONODOX) capsule 100 mg  Once     Route: Oral  Ordered Dose: 100 mg     Acknowledged Suanne Marker M    05/10/18 1544 05/10/18 1543  lidocaine (XYLOCAINE) 1 % injection 10 mL  Once     Route: Intradermal  Ordered Dose: 10 mL     Last MAR action:  Given Daney Moor M          LABORATORY RESULTS    Ordered and independently interpreted AVAILABLE laboratory tests. Please see results section in chart for full details.      CRITICAL CARE/PROCEDURES    Lac Repair  Date/Time: 05/10/2018 4:23 PM  Performed by: Arther Abbott, DO  Authorized  by: Arther Abbott, DO     Consent:     Consent obtained:  Verbal    Consent given by:  Patient    Risks discussed:  Infection, pain, poor cosmetic result, need for additional repair, nerve damage and tendon damage    Alternatives discussed:  No treatment, delayed treatment and referral  Anesthesia (see MAR for exact dosages):     Anesthesia method:  Local infiltration    Local anesthetic:  Lidocaine 1% w/o epi  Laceration details:     Location:  Finger    Finger location:  L index finger    Length (cm):  1    Depth (mm):  3  Repair type:     Repair type:  Complex  Pre-procedure details:     Preparation:  Patient was prepped and draped in usual sterile fashion  Exploration:     Limited defect created (wound extended): no      Hemostasis achieved with:  Direct pressure    Wound exploration: wound explored through full range of motion      Wound extent: tendon damage      Wound extent: no foreign bodies/material noted, no muscle damage noted, no nerve damage noted, no underlying fracture noted and no vascular damage noted      Tendon damage location:  Upper extremity    Upper extremity tendon damage location:  Finger extensor    Finger extensor tendon:  Extensor indicis    Tendon damage extent:  Partial transection    Tendon repair plan:  Repair at this visit    Contaminated: no     Treatment:     Area cleansed with:  Shur-Clens    Amount of cleaning:  Standard    Irrigation solution:  Sterile saline    Irrigation volume:  30    Irrigation method:  Syringe    Visualized foreign bodies/material removed: no      Debridement:  None    Undermining:  None    Scar revision: no    Tendon repair:     Suture size:  4-0    Suture material:  Monocryl    Suture technique:  Simple interrupted    Number of sutures:  1  Skin repair:     Repair method:  Sutures    Suture size:  5-0    Suture material:  Nylon    Suture technique:  Simple interrupted    Number of sutures:  3  Approximation:     Approximation:  Close  Post-procedure details:     Dressing:  Antibiotic ointment, splint for protection and bulky dressing    Patient tolerance of procedure:  Tolerated well, no immediate complications          DIAGNOSIS      Diagnosis:  Final diagnoses:   Laceration of left index finger w/o foreign body w/o damage to nail, initial encounter   Extensor tendon laceration of finger with open wound, initial encounter       Disposition:  ED Disposition     ED Disposition Condition Date/Time Comment    Discharge  Wed May 10, 2018  4:23 PM Suan Halter. discharge to home/self care.    Condition at disposition: Stable          Prescriptions:  Patient's Medications   New Prescriptions    DOXYCYCLINE (VIBRA-TABS) 100 MG TABLET    Take 1 tablet (100 mg total) by mouth 2 (two) times daily for 7 days   Previous  Medications    ACCU-CHEK FASTCLIX LANCETS MISC    Check blood sugar 7 times daily, as directed by MD.    ALBUTEROL (PROVENTIL HFA;VENTOLIN HFA) 108 (90 BASE) MCG/ACT INHALER    Inhale 2 puffs into the lungs every 6 (six) hours as needed for Wheezing or Shortness of Breath    FLUTICASONE FUROATE-VILANTEROL (BREO ELLIPTA) 200-25 MCG/INH AEROSOL POWDER, BREATH ACTIVTIVATEDE    Inhale 1 puff into the lungs daily    GLUCAGON (GLUCAGON EMERGENCY) 1 MG INJECTION    Inject 1 mg into the skin once as needed (hypoglycemia less  than 50)    GLUCOSE BLOOD (ACCU-CHEK AVIVA PLUS) TEST STRIP    Check blood sugar 7 times daily, as directed by MD.    INSULIN GLARGINE (LANTUS) 100 UNIT/ML INJECTION    Inject 25 Units into the skin every 12 (twelve) hours    INSULIN LISPRO (HUMALOG) 100 UNIT/ML INJECTION    Administer via carb count protocal   Modified Medications    No medications on file   Discontinued Medications    No medications on file            Arther Abbott, DO  05/10/18 1626

## 2018-07-01 ENCOUNTER — Observation Stay
Admission: EM | Admit: 2018-07-01 | Discharge: 2018-07-03 | DRG: 420 | Disposition: A | Discharge status: Home / Self Care | Payer: Medicaid HMO | Attending: Internal Medicine | Admitting: Internal Medicine

## 2018-07-01 DIAGNOSIS — E1110 Type 2 diabetes mellitus with ketoacidosis without coma: Secondary | ICD-10-CM | POA: Diagnosis present

## 2018-07-01 DIAGNOSIS — F17210 Nicotine dependence, cigarettes, uncomplicated: Secondary | ICD-10-CM | POA: Insufficient documentation

## 2018-07-01 DIAGNOSIS — R739 Hyperglycemia, unspecified: Secondary | ICD-10-CM

## 2018-07-01 DIAGNOSIS — E875 Hyperkalemia: Secondary | ICD-10-CM | POA: Insufficient documentation

## 2018-07-01 DIAGNOSIS — N179 Acute kidney failure, unspecified: Secondary | ICD-10-CM | POA: Insufficient documentation

## 2018-07-01 DIAGNOSIS — J45909 Unspecified asthma, uncomplicated: Secondary | ICD-10-CM | POA: Insufficient documentation

## 2018-07-01 DIAGNOSIS — E871 Hypo-osmolality and hyponatremia: Secondary | ICD-10-CM | POA: Insufficient documentation

## 2018-07-01 DIAGNOSIS — J111 Influenza due to unidentified influenza virus with other respiratory manifestations: Secondary | ICD-10-CM

## 2018-07-01 DIAGNOSIS — E10649 Type 1 diabetes mellitus with hypoglycemia without coma: Secondary | ICD-10-CM | POA: Insufficient documentation

## 2018-07-01 DIAGNOSIS — E1310 Other specified diabetes mellitus with ketoacidosis without coma: Secondary | ICD-10-CM

## 2018-07-01 DIAGNOSIS — N289 Disorder of kidney and ureter, unspecified: Secondary | ICD-10-CM

## 2018-07-01 DIAGNOSIS — E1010 Type 1 diabetes mellitus with ketoacidosis without coma: Principal | ICD-10-CM | POA: Insufficient documentation

## 2018-07-01 DIAGNOSIS — E860 Dehydration: Secondary | ICD-10-CM | POA: Insufficient documentation

## 2018-07-01 DIAGNOSIS — Z794 Long term (current) use of insulin: Secondary | ICD-10-CM | POA: Insufficient documentation

## 2018-07-01 DIAGNOSIS — J101 Influenza due to other identified influenza virus with other respiratory manifestations: Secondary | ICD-10-CM | POA: Insufficient documentation

## 2018-07-01 LAB — MAGNESIUM
Magnesium: 1.7 mg/dL (ref 1.6–2.6)
Magnesium: 1.8 mg/dL (ref 1.6–2.6)
Magnesium: 1.9 mg/dL (ref 1.6–2.6)
Magnesium: 2.6 mg/dL (ref 1.6–2.6)

## 2018-07-01 LAB — CBC AND DIFFERENTIAL
Absolute NRBC: 0 10*3/uL (ref 0.00–0.00)
Basophils Absolute Automated: 0.07 10*3/uL (ref 0.00–0.08)
Basophils Automated: 0.9 %
Eosinophils Absolute Automated: 0 10*3/uL (ref 0.00–0.44)
Eosinophils Automated: 0 %
Hematocrit: 47.2 % (ref 37.6–49.6)
Hgb: 15 g/dL (ref 12.5–17.1)
Immature Granulocytes Absolute: 0.05 10*3/uL (ref 0.00–0.07)
Immature Granulocytes: 0.6 %
Lymphocytes Absolute Automated: 1.12 10*3/uL (ref 0.42–3.22)
Lymphocytes Automated: 13.8 %
MCH: 25.9 pg (ref 25.1–33.5)
MCHC: 31.8 g/dL (ref 31.5–35.8)
MCV: 81.4 fL (ref 78.0–96.0)
MPV: 10.7 fL (ref 8.9–12.5)
Monocytes Absolute Automated: 0.86 10*3/uL — ABNORMAL HIGH (ref 0.21–0.85)
Monocytes: 10.6 %
Neutrophils Absolute: 6.01 10*3/uL (ref 1.10–6.33)
Neutrophils: 74.1 %
Nucleated RBC: 0 /100 WBC (ref 0.0–0.0)
Platelets: 152 10*3/uL (ref 142–346)
RBC: 5.8 10*6/uL (ref 4.20–5.90)
RDW: 13 % (ref 11–15)
WBC: 8.11 10*3/uL (ref 3.10–9.50)

## 2018-07-01 LAB — COMPREHENSIVE METABOLIC PANEL
ALT: 24 U/L (ref 0–55)
AST (SGOT): 45 U/L — ABNORMAL HIGH (ref 5–34)
Albumin/Globulin Ratio: 1.2 (ref 0.9–2.2)
Albumin: 4.1 g/dL (ref 3.5–5.0)
Alkaline Phosphatase: 106 U/L (ref 38–106)
Anion Gap: 28 — ABNORMAL HIGH (ref 5.0–15.0)
BUN: 16 mg/dL (ref 9–28)
Bilirubin, Total: 0.7 mg/dL (ref 0.2–1.2)
CO2: 11 mEq/L — ABNORMAL LOW (ref 22–29)
Calcium: 9.2 mg/dL (ref 8.5–10.5)
Chloride: 93 mEq/L — ABNORMAL LOW (ref 100–111)
Creatinine: 1.6 mg/dL — ABNORMAL HIGH (ref 0.7–1.3)
Globulin: 3.4 g/dL (ref 2.0–3.6)
Glucose: 473 mg/dL — ABNORMAL HIGH (ref 70–100)
Potassium: 5.4 mEq/L — ABNORMAL HIGH (ref 3.5–5.1)
Protein, Total: 7.5 g/dL (ref 6.0–8.3)
Sodium: 132 mEq/L — ABNORMAL LOW (ref 136–145)

## 2018-07-01 LAB — URINALYSIS, REFLEX TO MICROSCOPIC EXAM IF INDICATED
Bilirubin, UA: NEGATIVE
Blood, UA: NEGATIVE
Glucose, UA: >=500 — AB
Ketones UA: 80 — AB
Leukocyte Esterase, UA: NEGATIVE
Nitrite, UA: NEGATIVE
Protein, UR: 30 — AB
Specific Gravity UA: 1.028 (ref 1.001–1.035)
Urine pH: 6 (ref 5.0–8.0)
Urobilinogen, UA: NEGATIVE mg/dL (ref 0.2–2.0)

## 2018-07-01 LAB — BASIC METABOLIC PANEL
Anion Gap: 18 — ABNORMAL HIGH (ref 5.0–15.0)
Anion Gap: 11 (ref 5.0–15.0)
BUN: 17 mg/dL (ref 9–28)
BUN: 15 mg/dL (ref 9–28)
CO2: 14 mEq/L — ABNORMAL LOW (ref 22–29)
CO2: 18 mEq/L — ABNORMAL LOW (ref 22–29)
Calcium: 7.6 mg/dL — ABNORMAL LOW (ref 8.5–10.5)
Calcium: 7.7 mg/dL — ABNORMAL LOW (ref 8.5–10.5)
Chloride: 102 mEq/L (ref 100–111)
Chloride: 102 mEq/L (ref 100–111)
Creatinine: 1.3 mg/dL (ref 0.7–1.3)
Creatinine: 1.1 mg/dL (ref 0.7–1.3)
Glucose: 262 mg/dL — ABNORMAL HIGH (ref 70–100)
Glucose: 313 mg/dL — ABNORMAL HIGH (ref 70–100)
Potassium: 3.6 mEq/L (ref 3.5–5.1)
Potassium: 5 mEq/L (ref 3.5–5.1)
Sodium: 134 mEq/L — ABNORMAL LOW (ref 136–145)
Sodium: 131 mEq/L — ABNORMAL LOW (ref 136–145)

## 2018-07-01 LAB — GLUCOSE WHOLE BLOOD - POCT
Whole Blood Glucose POCT: 158 mg/dL — ABNORMAL HIGH (ref 70–100)
Whole Blood Glucose POCT: 233 mg/dL — ABNORMAL HIGH (ref 70–100)
Whole Blood Glucose POCT: 242 mg/dL — ABNORMAL HIGH (ref 70–100)
Whole Blood Glucose POCT: 247 mg/dL — ABNORMAL HIGH (ref 70–100)
Whole Blood Glucose POCT: 265 mg/dL — ABNORMAL HIGH (ref 70–100)
Whole Blood Glucose POCT: 284 mg/dL — ABNORMAL HIGH (ref 70–100)
Whole Blood Glucose POCT: 297 mg/dL — ABNORMAL HIGH (ref 70–100)
Whole Blood Glucose POCT: 317 mg/dL — ABNORMAL HIGH (ref 70–100)
Whole Blood Glucose POCT: 335 mg/dL — ABNORMAL HIGH (ref 70–100)
Whole Blood Glucose POCT: 426 mg/dL — ABNORMAL HIGH (ref 70–100)
Whole Blood Glucose POCT: 478 mg/dL — ABNORMAL HIGH (ref 70–100)

## 2018-07-01 LAB — PHOSPHORUS
Phosphorus: 3.8 mg/dL (ref 2.3–4.7)
Phosphorus: 3.8 mg/dL (ref 2.3–4.7)
Phosphorus: 2.9 mg/dL (ref 2.3–4.7)

## 2018-07-01 LAB — GFR
EGFR: >60
EGFR: >60
EGFR: >60

## 2018-07-01 LAB — HEMOGLOBIN A1C
Average Estimated Glucose: 320.7 mg/dL
Hemoglobin A1C: 12.8 % — ABNORMAL HIGH (ref 4.6–5.9)

## 2018-07-01 LAB — LIPASE: Lipase: <4 U/L — ABNORMAL LOW (ref 8–78)

## 2018-07-01 MED ORDER — INSULIN REGULAR HUMAN 100 UNIT/ML IJ SOLN
4.00 [IU] | Freq: Three times a day (TID) | INTRAMUSCULAR | Status: DC
Start: 2018-07-01 — End: 2018-07-02
  Administered 2018-07-01: 19:00:00 4 [IU] via INTRAVENOUS
  Filled 2018-07-01: qty 12

## 2018-07-01 MED ORDER — ACETAMINOPHEN 325 MG PO TABS
650.0000 mg | ORAL_TABLET | Freq: Four times a day (QID) | ORAL | Status: DC | PRN
Start: 2018-07-01 — End: 2018-07-03
  Administered 2018-07-01 – 2018-07-03 (×3): 650 mg via ORAL
  Filled 2018-07-01 (×3): qty 2

## 2018-07-01 MED ORDER — DEXTROSE 10 % IV BOLUS
125.00 mL | INTRAVENOUS | Status: DC | PRN
Start: 2018-07-01 — End: 2018-07-03

## 2018-07-01 MED ORDER — SENNOSIDES 8.8MG/5ML UNIT DOSE SYRINGE
8.80 mg | ORAL_SOLUTION | Freq: Every evening | ORAL | Status: DC | PRN
Start: 2018-07-01 — End: 2018-07-03

## 2018-07-01 MED ORDER — DEXTROSE-NACL 5-0.9 % IV SOLN
INTRAVENOUS | Status: DC
Start: 2018-07-01 — End: 2018-07-02

## 2018-07-01 MED ORDER — NITROFURANTOIN MONOHYD MACRO 100 MG PO CAPS
100.00 mg | ORAL_CAPSULE | Freq: Two times a day (BID) | ORAL | 0 refills | Status: DC
Start: 2018-07-01 — End: 2018-07-01

## 2018-07-01 MED ORDER — ONDANSETRON HCL 4 MG/2ML IJ SOLN
4.00 mg | Freq: Three times a day (TID) | INTRAMUSCULAR | Status: DC | PRN
Start: 2018-07-01 — End: 2018-07-03
  Administered 2018-07-02: 17:00:00 4 mg via INTRAVENOUS
  Filled 2018-07-01: qty 2

## 2018-07-01 MED ORDER — INSULIN REGULAR HUMAN 100 UNIT/ML IJ SOLN
4.00 [IU]/h | INTRAMUSCULAR | Status: DC
Start: 2018-07-01 — End: 2018-07-01
  Administered 2018-07-01 (×2): 4 [IU]/h via INTRAVENOUS
  Filled 2018-07-01: qty 1

## 2018-07-01 MED ORDER — SODIUM CHLORIDE 0.9 % IV BOLUS
1000.00 mL | Freq: Once | INTRAVENOUS | Status: AC
Start: 2018-07-01 — End: 2018-07-01
  Administered 2018-07-01: 14:00:00 1000 mL via INTRAVENOUS

## 2018-07-01 MED ORDER — MAGNESIUM SULFATE IN D5W 1-5 GM/100ML-% IV SOLN
1.0000 g | INTRAVENOUS | Status: AC
Start: 2018-07-01 — End: 2018-07-01
  Administered 2018-07-01 (×2): 1 g via INTRAVENOUS
  Filled 2018-07-01 (×2): qty 100

## 2018-07-01 MED ORDER — OSELTAMIVIR PHOSPHATE 75 MG PO CAPS
75.00 mg | ORAL_CAPSULE | Freq: Two times a day (BID) | ORAL | Status: DC
Start: 2018-07-01 — End: 2018-07-03
  Administered 2018-07-01 – 2018-07-03 (×4): 75 mg via ORAL
  Filled 2018-07-01 (×6): qty 1

## 2018-07-01 MED ORDER — INSULIN REGULAR HUMAN 100 UNIT/ML IJ SOLN
0.10 [IU]/kg/h | INTRAMUSCULAR | Status: DC
Start: 2018-07-01 — End: 2018-07-02
  Administered 2018-07-02: 01:00:00 2 [IU]/h via INTRAVENOUS
  Filled 2018-07-01: qty 1

## 2018-07-01 MED ORDER — ONDANSETRON 4 MG PO TBDP
4.00 mg | ORAL_TABLET | Freq: Four times a day (QID) | ORAL | 0 refills | Status: DC | PRN
Start: 2018-07-01 — End: 2018-07-01

## 2018-07-01 MED ORDER — OSELTAMIVIR PHOSPHATE 75 MG PO CAPS
75.00 mg | ORAL_CAPSULE | Freq: Once | ORAL | Status: AC
Start: 2018-07-01 — End: 2018-07-01
  Administered 2018-07-01: 15:00:00 75 mg via ORAL
  Filled 2018-07-01: qty 1

## 2018-07-01 MED ORDER — KETOROLAC TROMETHAMINE 30 MG/ML IJ SOLN
30.00 mg | Freq: Once | INTRAMUSCULAR | Status: AC
Start: 2018-07-01 — End: 2018-07-01
  Administered 2018-07-01: 14:00:00 30 mg via INTRAVENOUS
  Filled 2018-07-01: qty 1

## 2018-07-01 MED ORDER — HEPARIN SODIUM (PORCINE) 5000 UNIT/ML IJ SOLN
5000.00 [IU] | Freq: Three times a day (TID) | INTRAMUSCULAR | Status: DC
Start: 2018-07-01 — End: 2018-07-03
  Administered 2018-07-01 – 2018-07-02 (×4): 5000 [IU] via SUBCUTANEOUS
  Filled 2018-07-01 (×6): qty 1

## 2018-07-01 MED ORDER — SODIUM CHLORIDE 0.9 % IV SOLN
INTRAVENOUS | Status: DC
Start: 2018-07-01 — End: 2018-07-01

## 2018-07-01 MED ORDER — INSULIN REGULAR HUMAN 100 UNIT/ML IJ SOLN
5.00 [IU] | Freq: Once | INTRAMUSCULAR | Status: AC
Start: 2018-07-01 — End: 2018-07-01
  Administered 2018-07-01: 15:00:00 5 [IU] via INTRAVENOUS
  Filled 2018-07-01: qty 15

## 2018-07-01 MED ORDER — NITROGLYCERIN 0.4 MG SL SUBL
0.4000 mg | SUBLINGUAL_TABLET | SUBLINGUAL | Status: DC
Start: 2018-07-01 — End: 2018-07-01

## 2018-07-01 MED ORDER — SODIUM CHLORIDE 0.9 % IV BOLUS
1000.00 mL | Freq: Once | INTRAVENOUS | Status: AC
Start: 2018-07-01 — End: 2018-07-01
  Administered 2018-07-01: 15:00:00 1000 mL via INTRAVENOUS

## 2018-07-01 MED ORDER — NITROFURANTOIN MONOHYD MACRO 100 MG PO CAPS
100.00 mg | ORAL_CAPSULE | Freq: Once | ORAL | Status: DC
Start: 2018-07-01 — End: 2018-07-01

## 2018-07-01 MED ORDER — FLUTICASONE FUROATE-VILANTEROL 200-25 MCG/INH IN AEPB
1.00 | INHALATION_SPRAY | Freq: Every morning | RESPIRATORY_TRACT | Status: DC
Start: 2018-07-01 — End: 2018-07-03
  Administered 2018-07-01 – 2018-07-03 (×3): 1 via RESPIRATORY_TRACT
  Filled 2018-07-01: qty 14

## 2018-07-01 MED ORDER — PROMETHAZINE HCL 25 MG/ML IJ SOLN
12.50 mg | Freq: Once | INTRAMUSCULAR | Status: DC
Start: 2018-07-01 — End: 2018-07-01

## 2018-07-01 MED ORDER — SODIUM CHLORIDE 0.9 % IV SOLN
12.50 mg | Freq: Once | INTRAVENOUS | Status: AC
Start: 2018-07-01 — End: 2018-07-01
  Administered 2018-07-01: 14:00:00 12.5 mg via INTRAVENOUS
  Filled 2018-07-01: qty 1

## 2018-07-01 MED ORDER — ALBUTEROL SULFATE (2.5 MG/3ML) 0.083% IN NEBU
2.50 mg | INHALATION_SOLUTION | Freq: Three times a day (TID) | RESPIRATORY_TRACT | Status: DC
Start: 2018-07-01 — End: 2018-07-02
  Administered 2018-07-01 – 2018-07-02 (×3): 2.5 mg via RESPIRATORY_TRACT
  Filled 2018-07-01 (×3): qty 3

## 2018-07-01 NOTE — ED Notes (Signed)
Bed: E14  Expected date: 07/01/18  Expected time: 12:44 PM  Means of arrival:   Comments:  Medic 424 31M hyperglycemia BS 451 VSS

## 2018-07-01 NOTE — Progress Notes (Signed)
Denies nausea.  Wants to eat.    No contraindication for eating while on DKA protocol and insulin gtt.   Ordered bolus of 4u of iv insulin before meals while on insulin gtt (assuming carb ratio of 1:15 and 60 g of carb with meals consistent with diabetic diet).     4u IV bolus insulin before meals ONLY for duration of time while he is on insulin drip.    D/W nursing.

## 2018-07-01 NOTE — EDIE (Signed)
COLLECTIVE?NOTIFICATION?07/01/2018 12:52?DESMITH, Sadiel?MRN: [id number]    [place] [facility]'s patient encounter information:   MRN:[id number]  Account [id number]  Billing Account [id number]      Criteria Met      5 ED Visits in 12 Months    Security and Safety  No recent Security Events currently on file    ED Care Guidelines  There are currently no ED Care Guidelines for this patient. Please check your facility's medical records system.        Prescription Monitoring Program  PDMP query found no report.  Narx Score not available at this time.      E.D. Visit Count (12 mo.)  Facility Visits   [place] [facility] 4   [facility] 2   Total 6   Note: Visits indicate total known visits.      Recent Emergency Department Visit Summary  Date Facility [facility] Type Diagnoses or Chief Complaint   Jul 01, 2018 [place] [facility] [place] [facility]      Hyperglycemia      May 10, 2018 [place] [facility] [place] [facility]      medic-      Laceration      Laceration w/o fb of l idx fngr w/o damage to nail, init      Unsp open wound of unsp finger w/o damage to nail, init      Lacerat extn musc/fasc/tend unsp finger at forarm lv, init      May 04, 2018 [place] [facility] [place] [facility]      nausea, emesis      Nausea      Hyperglycemia      1 Type 1 diabetes mellitus with ketoacidosis without coma      Apr 19, 2018 [place] [facility] [place] [facility]      med refill      Medication Refill      Hyperglycemia, unspecified      Nov 24, 2017 [facility] [place] [place] [facility]     Sep 06, 2017 [facility]. [place] [place] [facility]     Jul 25, 2017 [place] [facility] [employer]         Recent Inpatient Visit Summary  Date Facility [facility] Type Diagnoses or Chief Complaint   May 04, 2018  [facility]      1 Type 1 diabetes mellitus with ketoacidosis without coma           Care Team  There is not a care team on record at this time.   Collective Portal  This patient has registered at the [facility] Emergency Department   For more information visit: [email or web address]     PLEASE NOTE:     1.   Any care recommendations and other clinical information are provided as guidelines or for historical purposes only, and providers should exercise their own clinical judgment when providing care.    2.   You may only use this information for purposes of treatment, payment or health care operations activities, and subject to the limitations of applicable [employer] Policies.    3.   You should consult directly with the organization that provided a care guideline or other clinical history with any questions about additional information or accuracy or completeness of information provided.    ? 2019  [employer] - www.[employer]medical.com

## 2018-07-01 NOTE — ED Provider Notes (Signed)
Physician/Midlevel provider first contact with patient: 07/01/18 1308         History     Chief Complaint   Patient presents with   . Hyperglycemia       Chief Complaint: Aching  Onset/Duration: Last night  Quality/Location: Generalized  Severity: Mild to moderate  Aggravating Factors: None  Alleviating Factors: None  Associated Symptoms/ Additional Comments: Nausea/vomiting    PMH IDDM, complaints of approximate 24-hour history generalized fatigue and myalgias, with associated nausea/nonbloody vomiting.  Occasional dry cough.  Noted blood glucose 400-500s x2 days.  Denies fever.  Family history-noncontributory  Social history-noncontributory        The history is provided by the patient and a significant other.   Hyperglycemia   Associated symptoms: nausea and vomiting    Associated symptoms: no abdominal pain, no chest pain, no dizziness, no dysuria, no fever, no shortness of breath and no weakness             Past Medical History:   Diagnosis Date   . Diabetes mellitus    . Seasonal asthma        History reviewed. No pertinent surgical history.    History reviewed. No pertinent family history.    Social  Social History     Tobacco Use   . Smoking status: Current Some Day Smoker     Types: Cigarettes   . Smokeless tobacco: Never Used   Substance Use Topics   . Alcohol use: Yes     Comment: socially    . Drug use: Yes     Comment: marijuana a couple times a week        .     Allergies   Allergen Reactions   . Cephalosporins    . Gantrisin [Sulfisoxazole]        Home Medications     Med List Status:  In Progress Set By: Sharen Hones, RN at 07/01/2018  1:05 PM                ACCU-CHEK FASTCLIX LANCETS Misc     Check blood sugar 7 times daily, as directed by MD.     albuterol (PROVENTIL HFA;VENTOLIN HFA) 108 (90 Base) MCG/ACT inhaler     Inhale 2 puffs into the lungs every 6 (six) hours as needed for Wheezing or Shortness of Breath     fluticasone furoate-vilanterol (BREO ELLIPTA) 200-25 MCG/INH Aerosol Powder, Breath  Activtivatede     Inhale 1 puff into the lungs daily     glucagon (GLUCAGON EMERGENCY) 1 MG injection     Inject 1 mg into the skin once as needed (hypoglycemia less than 50)     glucose blood (ACCU-CHEK AVIVA PLUS) test strip     Check blood sugar 7 times daily, as directed by MD.     insulin glargine (LANTUS) 100 UNIT/ML injection     Inject 25 Units into the skin every 12 (twelve) hours     insulin lispro (HUMALOG) 100 UNIT/ML injection     Administer via carb count protocal           Review of Systems   Constitutional: Negative for fever.   HENT: Negative for congestion and sore throat.    Eyes: Negative for redness and visual disturbance.   Respiratory: Positive for cough. Negative for shortness of breath.    Cardiovascular: Negative for chest pain and palpitations.   Gastrointestinal: Positive for nausea and vomiting. Negative for abdominal pain and diarrhea.   Genitourinary:  Negative for difficulty urinating and dysuria.   Musculoskeletal: Positive for back pain and myalgias.   Skin: Negative for rash.   Neurological: Negative for dizziness, weakness and headaches.   Psychiatric/Behavioral: Negative for suicidal ideas. The patient is not nervous/anxious.        Physical Exam    BP: 105/54, Heart Rate: 97, Temp: 98.3 F (36.8 C), Resp Rate: 18, SpO2: 100 %, Weight: 62.8 kg    Physical Exam  Constitutional:       General: He is not in acute distress.     Appearance: He is well-developed.      Comments: Nontoxic, comfortable, no apparent distress   HENT:      Head: Normocephalic and atraumatic.   Eyes:      Conjunctiva/sclera: Conjunctivae normal.      Pupils: Pupils are equal, round, and reactive to light.   Neck:      Musculoskeletal: Normal range of motion.   Cardiovascular:      Rate and Rhythm: Regular rhythm. Tachycardia present.   Pulmonary:      Effort: Pulmonary effort is normal. No respiratory distress.      Breath sounds: Normal breath sounds.   Abdominal:      Palpations: Abdomen is soft. There is  no mass.      Tenderness: There is no abdominal tenderness.   Musculoskeletal: Normal range of motion.      Right shoulder: He exhibits no deformity.   Lymphadenopathy:      Cervical: No cervical adenopathy.   Skin:     General: Skin is warm.      Findings: No rash.   Neurological:      Mental Status: He is alert and oriented to person, place, and time.      GCS: GCS eye subscore is 4. GCS verbal subscore is 5. GCS motor subscore is 6.      Cranial Nerves: Cranial nerves are intact.      Sensory: Sensation is intact.      Motor: Motor function is intact.      Coordination: Coordination is intact.   Psychiatric:         Mood and Affect: Mood normal.         Speech: Speech normal.         Behavior: Behavior normal.         Cognition and Memory: Cognition normal.           MDM and ED Course     ED Medication Orders (From admission, onward)    Start Ordered     Status Ordering Provider    07/01/18 1511 07/01/18 1510  oseltamivir (TAMIFLU) capsule 75 mg  Once     Route: Oral  Ordered Dose: 75 mg     Last MAR action:  Given Lexington Krotz JEFFREY    07/01/18 1511 07/01/18 1510  sodium chloride 0.9 % bolus 1,000 mL  Once     Route: Intravenous  Ordered Dose: 1,000 mL     Last MAR action:  New Bag Levie Wages JEFFREY    07/01/18 1511 07/01/18 1510    Every 5 min     Route: Sublingual  Ordered Dose: 0.4 mg     Discontinued Daivion Pape JEFFREY    07/01/18 1442 07/01/18 1441    Once     Route: Oral  Ordered Dose: 100 mg     Discontinued Para Cossey JEFFREY    07/01/18 1423 07/01/18 1422  insulin regular (HumuLIN R,NovoLIN  R) injection 5 Units  Once     Route: Intravenous  Ordered Dose: 5 Units     Last MAR action:  Given Veryl Winemiller JEFFREY    07/01/18 1423 07/01/18 1422  insulin regular (HumuLIN R,NovoLIN R) 100 Units in sodium chloride 0.9 % 100 mL IV infusion  Continuous     Route: Intravenous  Ordered Dose: 4 Units/hr     Last MAR action:  New Bag Nishika Parkhurst JEFFREY    07/01/18 1422 07/01/18  1422  0.9%  NaCl infusion  Continuous     Route: Intravenous     Acknowledged Alaia Lordi JEFFREY    07/01/18 1317 07/01/18 1316  promethazine (PHENERGAN) 12.5 mg in sodium chloride 0.9 % 50 mL IVPB  Once     Route: Intravenous  Ordered Dose: 12.5 mg     Last MAR action:  Given Terrisa Curfman JEFFREY    07/01/18 1315 07/01/18 1314    Once     Route: Intravenous  Ordered Dose: 12.5 mg     Discontinued Analissa Bayless JEFFREY    07/01/18 1315 07/01/18 1314  ketorolac (TORADOL) injection 30 mg  Once     Route: Intravenous  Ordered Dose: 30 mg     Last MAR action:  Given Benigno Check JEFFREY    07/01/18 1310 07/01/18 1309  sodium chloride 0.9 % bolus 1,000 mL  Once     Route: Intravenous  Ordered Dose: 1,000 mL     Last MAR action:  New Bag Kyarah Enamorado JEFFREY             MDM  Number of Diagnoses or Management Options  Diabetic ketoacidosis without coma associated with other specified diabetes mellitus: new and requires workup  Hyperkalemia: minor  Hyponatremia: minor  Influenza: new and requires workup  Renal insufficiency: new and requires workup     Amount and/or Complexity of Data Reviewed  Clinical lab tests: ordered and reviewed  Obtain history from someone other than the patient: yes  Discuss the patient with other providers: yes  Independent visualization of images, tracings, or specimens: yes    Risk of Complications, Morbidity, and/or Mortality  Presenting problems: high  Diagnostic procedures: high  Management options: high    Critical Care  Total time providing critical care: 30-74 minutes    Patient Progress  Patient progress: stable        ED Course as of Jul 02 1523   Sat Jul 01, 2018   1512 Admission accepted by critical care.    [MP]      ED Course User Index  [MP] Laurell Roof, Earney Hamburg, MD             Critical Care  Performed by: Herma Ard, MD  Authorized by: Herma Ard, MD     Critical care provider statement:     Critical care time (minutes):  30     Critical care was necessary to treat or prevent imminent or life-threatening deterioration of the following conditions:  Cardiac failure, circulatory failure, dehydration, endocrine crisis, metabolic crisis, renal failure and shock    Critical care was time spent personally by me on the following activities:  Development of treatment plan with patient or surrogate, discussions with primary provider, evaluation of patient's response to treatment, examination of patient, interpretation of cardiac output measurements, obtaining history from patient or surrogate, ordering and performing treatments and interventions, ordering and review of laboratory studies, ordering and review of radiographic studies, pulse oximetry and re-evaluation  of patient's condition        Clinical Impression & Disposition     Clinical Impression  Final diagnoses:   Diabetic ketoacidosis without coma associated with other specified diabetes mellitus   Renal insufficiency   Hyponatremia   Hyperkalemia   Influenza        ED Disposition     ED Disposition Condition Date/Time Comment    Admit  Sat Jul 01, 2018  3:13 PM Admitting Physician: Chase Crews, Redmond Baseman [[id number]]   Diagnosis: DKA (diabetic ketoacidoses) [161096]   Estimated Length of Stay: > or = to 2 midnights   Tentative Discharge Plan?: Home or Self Care [1]   Patient Class: Inpatient [101]             Current Discharge Medication List                    Herma Ard, MD  07/01/18 1530

## 2018-07-01 NOTE — H&P (Signed)
Critical Care Attending Note: Luz Brazen, MD     [facility]  Critical Care Admission Note   Note Date: 07/01/2018  Note Time: 4:41 PM    Patient: [name] [name].  MRN: [id number]  Age/Sex: 22 y.o. male  DOB: 04/30/1996  Code Status: Full Code  Date of Admission: 07/01/2018       Brief Historical Review:   [name] Nham. is a 22 y.o. male with hx of T1DM (dx at age of 66), recent admission in NOV 2019 for DKA, does not follow any physician on regular basis, asthma and smoker, with insulin regimen of 25u Lantus BID and Novolog sliding scale per carbohydrate count who presented today with 3-days of malaise, body aches, poor appetite, stomach aches, no po intake x 3 days (although reports has been taking his lantus 25u BID), found to have DKA and +influenza B.  He reports subjective fevers and sweats over last day.     He is not on any maintenance inhalers for underlying asthma.  Does take albuterol MDI on PRN basis (last use was 2 days ago).  Denies subjective wheezes or respiratory distress.  Denies sick contacts.  Denies sputum production.  Mild URI symptoms/congestion.       In the ED:  He got 2L of IVF and saline maintenance IVF at 200 cc/h.  Started on IV insulin drip per DKA protocol after 5u of IV insulin and also got one dose of toradol.  In addition, he got a dose of Tamiflu.     After transfer to ICU from ED:  Asking to drink water.  Denies N/V.  Remains on RA.       Physical Exam:     Vital Signs:  BP 108/55   Pulse 90   Temp 98.9 F (37.2 C) (Oral)   Resp 22   Ht 1.727 m (5\' 8" )   Wt 62.8 kg (138 lb 7.2 oz)   SpO2 100%   BMI 21.05 kg/m     GENERAL: Alert, awake, cooperative  HEENT: non-icteric, no pallor.   NECK: supple.   LUNGS: Mostly clear.  Few wheezes R base.  No rales/rhonchi  HEART: RRR (sinus on the monitor.   ABD: soft, NT, ND.   EXT: no edema.  NEURO: MAE x 4.  Grossly non-focal.   Skin: no rashes.   Catheters/Drains: none    Ventilator Settings:         Radiologic Images Reviewed:     All imaging personally reviewed.     Input / Output:            No intake or output data in the 24 hours ending 07/01/18 1641   Critical Care Issues:     1. DKA  2. Influenza B  3. Type 1 diabetes (dx at age of 15).  4. Smoker (on and off)  5. Asthma with mild bronchospasm (not on chronic maintenance inhaler)  6. AKI  7. Dehydration/volume depletion  8. AGMA due to DKA  9. Pseudohyponatremia    Main ICU Plans:     Neuro:   - stable, monitor.      Respiratory:   - Maintain O2 sat>94%  - albuterol nebs q8  - Start Breo Ellipta  - Tamiflu 75 mg BID x 5 days  - If symptoms suggestive of lower respiratory tract infection (viral PNA or bacterial superinfection), will do CXR.   - Smoking cessation      Cardiovascular:   - MAP  goal>65  - Stable, monitor      GI:   - Bowel regimen: n/a.   - Nutrition: NPO/ sips and water OK.  If want to eat a meal while on DKA protocol, would add 4 units of IV insulin bolus prior to meals.   Otherwise transition to diabetic diet once out of DKA picture.   - GI ppx: n/a.       Renal: Mild AKI in setting of DKA/volume depletion/viral infection/poor appetite.   - Monitor UOP, strict I/Os, trend BUN/Cr, daily weights  - Monitor and correct lytes prn  - Maintenance IVF: Saline infusion  - Foley: n/a.       Hematology:   - No signs of active bleeding  - Trend H/H, plts, coags  - DVT ppx: SCDs and sqh      ID: Rapid flu swab +Inflenza B.  - Tramiflu x 5 days as per "respiratory" section      Endo: DKA (IDDM), multiple admissions.   - IVF hydration  - IV insulin gtt.  - Monitor electrolyte and replete as needed.  - Once BS < 250 and if AG still open, switch saline infusion to dextrose and continue along with insulin gtt until AG closes per protocol.   - Transition to home insulin regimen as noted in HPI once AG closes.       Dispo: admitted to ICU 12/28 for DKA management    Family: plan of care d/w patient.     My cumulative time taking care of this critically ill  patient (exclusive of procedures) is 35 minutes.          Greater than 50% spent in counseling and/or coordination of care regarding current medical management and future plan of care         Line, Drains,Airway:   Patient Lines/Drains/Airways Status    Active PICC Line / CVC Line / PIV Line / Drain / Airway / Intraosseous Line / Epidural Line / ART Line / Line / Wound / Pressure Ulcer / NG/OG Tube     Name:   Placement date:   Placement time:   Site:   Days:    Peripheral IV 07/01/18 Left Antecubital   07/01/18    1326    Antecubital   less than 1                Hospital Problem List:  Active Hospital Problems    Diagnosis   . DKA (diabetic ketoacidoses)     Past Medical/Surgical History:  Past Medical History:   Diagnosis Date   . Diabetes mellitus    . Seasonal asthma     History reviewed. No pertinent surgical history.   Allergies:  He is allergic to cephalosporins and gantrisin [sulfisoxazole].    Home Medications:  The following is a compilation of best available information at the time of admission.  Other sources may be utilized to further obtain medication histories and/or to clarify conflicting information, if applicable.   No current facility-administered medications on file prior to encounter.      Current Outpatient Medications on File Prior to Encounter   Medication Sig Dispense Refill   . ACCU-CHEK FASTCLIX LANCETS Misc Check blood sugar 7 times daily, as directed by MD. 204 each 11   . albuterol (PROVENTIL HFA;VENTOLIN HFA) 108 (90 Base) MCG/ACT inhaler Inhale 2 puffs into the lungs every 6 (six) hours as needed for Wheezing or Shortness of Breath 18 g 0   . fluticasone furoate-vilanterol (  BREO ELLIPTA) 200-25 MCG/INH Aerosol Powder, Breath Activtivatede Inhale 1 puff into the lungs daily 28 each 0   . glucagon (GLUCAGON EMERGENCY) 1 MG injection Inject 1 mg into the skin once as needed (hypoglycemia less than 50) 1 kit 0   . glucose blood (ACCU-CHEK AVIVA PLUS) test strip Check blood sugar 7 times  daily, as directed by MD. 200 each 11   . insulin glargine (LANTUS) 100 UNIT/ML injection Inject 25 Units into the skin every 12 (twelve) hours 1 pen 0   . insulin lispro (HUMALOG) 100 UNIT/ML injection Administer via carb count protocal 1 pen 3       Family History:  History reviewed. No pertinent family history.  Social History:  Social History     Socioeconomic History   . Marital status: Single     Spouse name: Not on file   . Number of children: Not on file   . Years of education: Not on file   . Highest education level: Not on file   Occupational History   . Not on file   Social Needs   . Financial resource strain: Not on file   . Food insecurity:     Worry: Not on file     Inability: Not on file   . Transportation needs:     Medical: Not on file     Non-medical: Not on file   Tobacco Use   . Smoking status: Current Some Day Smoker     Types: Cigarettes   . Smokeless tobacco: Never Used   Substance and Sexual Activity   . Alcohol use: Yes     Comment: socially    . Drug use: Yes     Comment: marijuana a couple times a week    . Sexual activity: Not on file   Lifestyle   . Physical activity:     Days per week: Not on file     Minutes per session: Not on file   . Stress: Not on file   Relationships   . Social connections:     Talks on phone: Not on file     Gets together: Not on file     Attends religious service: Not on file     Active member of club or organization: Not on file     Attends meetings of clubs or organizations: Not on file     Relationship status: Not on file   . Intimate partner violence:     Fear of current or ex partner: Not on file     Emotionally abused: Not on file     Physically abused: Not on file     Forced sexual activity: Not on file   Other Topics Concern   . Not on file   Social History Narrative   . Not on file       Review of Systems:  10 point ROS was reviewed with the patient and pertinent findings are noted in the HPI.  ROS is otherwise negative or unobtainable due to acuity of  illness.      SCHEDULED MEDICATIONS: PRN MEDS:   Current Facility-Administered Medications   Medication Dose Route Frequency   . fluticasone furoate-vilanterol  1 puff Inhalation QAM   . magnesium sulfate  1 g Intravenous Q1H   . oseltamivir  75 mg Oral Q12H SCH    dextrose, dextrose   INFUSION MEDS:    . sodium chloride 200 mL/hr at 07/01/18 1626   . insulin  regular 4 Units/hr (07/01/18 1625)         Labs Reviewed:  Results     Procedure Component Value Units Date/Time    Phosphorus [[id number]] Collected:  07/01/18 1622    Specimen:  Blood Updated:  07/01/18 1625    Magnesium [[id number]] Collected:  07/01/18 1622    Specimen:  Blood Updated:  07/01/18 1625    Narrative:       To begin when Anion Gap is less than 12.    Phosphorus [[id number]] Collected:  07/01/18 1622    Specimen:  Blood Updated:  07/01/18 1625    Narrative:       To begin when Anion Gap is less than 12.    Magnesium [[id number]] Collected:  07/01/18 1622    Specimen:  Blood Updated:  07/01/18 1625    Basic Metabolic Panel [[id number]] Collected:  07/01/18 1622    Specimen:  Blood Updated:  07/01/18 1625    Narrative:       To begin when Anion Gap is less than 12.    Glucose Whole Blood - POCT [[id number]]  (Abnormal) Collected:  07/01/18 1549     Updated:  07/01/18 1553     POCT - Glucose Whole blood 317 mg/dL     Glucose Whole Blood - POCT [[id number]]  (Abnormal) Collected:  07/01/18 1436     Updated:  07/01/18 1439     POCT - Glucose Whole blood 426 mg/dL     Comprehensive metabolic panel [[id number]]  (Abnormal) Collected:  07/01/18 1327    Specimen:  Blood Updated:  07/01/18 1408     Glucose 473 mg/dL      BUN 16 mg/dL      Creatinine 1.6 mg/dL      Sodium 756 mEq/L      Potassium 5.4 mEq/L      Chloride 93 mEq/L      CO2 11 mEq/L      Calcium 9.2 mg/dL      Protein, Total 7.5 g/dL      Albumin 4.1 g/dL      AST (SGOT) 45 U/L      ALT 24 U/L      Alkaline Phosphatase 106 U/L      Bilirubin, Total 0.7 mg/dL      Globulin 3.4 g/dL       Albumin/Globulin Ratio 1.2     Anion Gap 28.0    Lipase [[id number]]  (Abnormal) Collected:  07/01/18 1327    Specimen:  Blood Updated:  07/01/18 1408     Lipase <4 U/L     Magnesium [[id number]] Collected:  07/01/18 1327    Specimen:  Blood Updated:  07/01/18 1408     Magnesium 1.7 mg/dL     GFR [[id number]] Collected:  07/01/18 1327     Updated:  07/01/18 1408     EGFR >60.0    UA  with reflex to micro (pts  3 + yrs) [[id number]]  (Abnormal) Collected:  07/01/18 1353    Specimen:  Urine Updated:  07/01/18 1408     Urine Type Clean Catch     Color, UA Colorless     Clarity, UA Clear     Specific Gravity UA 1.028     Urine pH 6.0     Leukocyte Esterase, UA Negative     Nitrite, UA Negative     Protein, UR 30     Glucose, UA >=500     Ketones UA 80     Urobilinogen,  UA Negative mg/dL      Bilirubin, UA Negative     Blood, UA Negative     RBC, UA 0 - 2 /hpf     Rapid influenza A/B antigens [[id number]] Collected:  07/01/18 1327    Specimen:  Nasopharyngeal from Nasal Aspirate Updated:  07/01/18 1358    Narrative:       ORDER#: [id number]                                    ORDERED BY: Deirdre Pippins  SOURCE: Nasal Aspirate                               COLLECTED:  07/01/18 13:27  ANTIBIOTICS AT COLL.:                                RECEIVED :  07/01/18 13:32  Influenza Rapid Antigen A&B                FINAL       07/01/18 13:58   +  07/01/18   Positive for Influenza B and Negative for Influenza A             Reference Range: Negative      CBC and differential [[id number]]  (Abnormal) Collected:  07/01/18 1327    Specimen:  Blood Updated:  07/01/18 1337     WBC 8.11 x10 3/uL      Hgb 15.0 g/dL      Hematocrit 78.4 %      Platelets 152 x10 3/uL      RBC 5.80 x10 6/uL      MCV 81.4 fL      MCH 25.9 pg      MCHC 31.8 g/dL      RDW 13 %      MPV 10.7 fL      Neutrophils 74.1 %      Lymphocytes Automated 13.8 %      Monocytes 10.6 %      Eosinophils Automated 0.0 %      Basophils Automated 0.9 %      Immature Granulocyte 0.6 %       Nucleated RBC 0.0 /100 WBC      Neutrophils Absolute 6.01 x10 3/uL      Abs Lymph Automated 1.12 x10 3/uL      Abs Mono Automated 0.86 x10 3/uL      Abs Eos Automated 0.00 x10 3/uL      Absolute Baso Automated 0.07 x10 3/uL      Absolute Immature Granulocyte 0.05 x10 3/uL      Absolute NRBC 0.00 x10 3/uL     Glucose Whole Blood - POCT [[id number]]  (Abnormal) Collected:  07/01/18 1308     Updated:  07/01/18 1310     POCT - Glucose Whole blood 478 mg/dL         Microbiology:  Microbiology Results     Procedure Component Value Units Date/Time    Rapid influenza A/B antigens [[id number]] Collected:  07/01/18 1327    Specimen:  Nasopharyngeal from Nasal Aspirate Updated:  07/01/18 1358    Narrative:       ORDER#: [id number]  ORDERED BY: PULIZZI, MICHAE  SOURCE: Nasal Aspirate                               COLLECTED:  07/01/18 13:27  ANTIBIOTICS AT COLL.:                                RECEIVED :  07/01/18 13:32  Influenza Rapid Antigen A&B                FINAL       07/01/18 13:58   +  07/01/18   Positive for Influenza B and Negative for Influenza A             Reference Range: Negative          Radiologic Image Reports Reviewed:  No orders to display       I have reviewed relevant laboratory results, microbiology results, consultation notes, outside records, pathology results and other progress notes which may not be part of this note.  Radiology images and all laboratory data were independently reviewed in the EHR.       Luz Brazen, MD  07/01/18;  4:41 PM

## 2018-07-01 NOTE — Plan of Care (Signed)
Problem: Safety  Goal: Patient will be free from injury during hospitalization  Outcome: Progressing  Flowsheets (Taken 07/01/2018 1831)  Patient will be free from injury during hospitalization : Assess patient's risk for falls and implement fall prevention plan of care per policy;Provide and maintain safe environment;Use appropriate transfer methods;Ensure appropriate safety devices are available at the bedside;Include patient/ family/ care giver in decisions related to safety;Hourly rounding;Assess for patients risk for elopement and implement Elopement Risk Plan per policy  Note:   Safe environment maintained. Ambu and suction at bedside. Hourly rounding in place, Bed alarm activated, bed in lowest position, Pt assessed for elopement. Call bell within reach. Pt instructed to call for help before getting up.   Goal: Patient will be free from infection during hospitalization  Outcome: Progressing  Flowsheets (Taken 07/01/2018 1831)  Free from Infection during hospitalization: Assess and monitor for signs and symptoms of infection;Monitor lab/diagnostic results;Monitor all insertion sites (i.e. indwelling lines, tubes, urinary catheters, and drains);Encourage patient and family to use good hand hygiene technique  Note:   Pt admitted for DKA and is positive for influenza type B. Pt started on tamiflu. IV sites monitored Q 2 hours. WBCs WNL. Pt remains afebrile. labs trended.      Problem: Pain  Goal: Pain at adequate level as identified by patient  Outcome: Progressing  Flowsheets (Taken 07/01/2018 1835)  Pain at adequate level as identified by patient: Identify patient comfort function goal;Assess for risk of opioid induced respiratory depression, including snoring/sleep apnea. Alert healthcare team of risk factors identified.;Assess pain on admission, during daily assessment and/or before any "as needed" intervention(s);Reassess pain within 30-60 minutes of any procedure/intervention, per Pain Assessment,  Intervention, Reassessment (AIR) Cycle;Evaluate if patient comfort function goal is met  Note:   Pain assessed Q 4 hours using the numeric scale. Pt with no complaints of pain     Problem: Fluid and Electrolyte Imbalance/ Endocrine  Goal: Fluid and electrolyte balance are achieved/maintained  Outcome: Progressing  Flowsheets (Taken 07/01/2018 1841)  Fluid and electrolyte balance are achieved/maintained: Monitor intake and output every shift; Monitor/assess lab values and report abnormal values; Provide adequate hydration; Assess for confusion/personality changes; Assess and reassess fluid and electrolyte status; Observe for seizure activity and initiate seizure precautions if indicated; Observe for cardiac arrhythmias  Note:   Pt admitted for DKA. Pt has a hx of DM. Pt on insulin gtts. Glucose checks Q hour. Pt on DKA protocol. Iv D5 NS going at 150 mL/hr.

## 2018-07-01 NOTE — Progress Notes (Incomplete)
Pt admitted from ICU from ED. Report received from [facility]. Pt on droplet precautions and positive for flu B. Pt received tamiflu, 2 L NS boluses, phenergan, and Toradol. Pt on Insulin drip started in ED. CHG bath given. Blood glucose checked and is. VS stable.

## 2018-07-02 ENCOUNTER — Inpatient Hospital Stay: Payer: Medicaid HMO

## 2018-07-02 DIAGNOSIS — Z794 Long term (current) use of insulin: Secondary | ICD-10-CM

## 2018-07-02 DIAGNOSIS — J101 Influenza due to other identified influenza virus with other respiratory manifestations: Secondary | ICD-10-CM

## 2018-07-02 DIAGNOSIS — E1065 Type 1 diabetes mellitus with hyperglycemia: Secondary | ICD-10-CM

## 2018-07-02 LAB — CBC
Absolute NRBC: 0 10*3/uL (ref 0.00–0.00)
Hematocrit: 39.5 % (ref 37.6–49.6)
Hgb: 12.9 g/dL (ref 12.5–17.1)
MCH: 25.9 pg (ref 25.1–33.5)
MCHC: 32.7 g/dL (ref 31.5–35.8)
MCV: 79.3 fL (ref 78.0–96.0)
MPV: 11.6 fL (ref 8.9–12.5)
Nucleated RBC: 0 /100 WBC (ref 0.0–0.0)
Platelets: 184 10*3/uL (ref 142–346)
RBC: 4.98 10*6/uL (ref 4.20–5.90)
RDW: 13 % (ref 11–15)
WBC: 6.32 10*3/uL (ref 3.10–9.50)

## 2018-07-02 LAB — GLUCOSE WHOLE BLOOD - POCT
Whole Blood Glucose POCT: 158 mg/dL — ABNORMAL HIGH (ref 70–100)
Whole Blood Glucose POCT: 160 mg/dL — ABNORMAL HIGH (ref 70–100)
Whole Blood Glucose POCT: 173 mg/dL — ABNORMAL HIGH (ref 70–100)
Whole Blood Glucose POCT: 202 mg/dL — ABNORMAL HIGH (ref 70–100)
Whole Blood Glucose POCT: 178 mg/dL — ABNORMAL HIGH (ref 70–100)
Whole Blood Glucose POCT: 166 mg/dL — ABNORMAL HIGH (ref 70–100)
Whole Blood Glucose POCT: 168 mg/dL — ABNORMAL HIGH (ref 70–100)
Whole Blood Glucose POCT: 326 mg/dL — ABNORMAL HIGH (ref 70–100)
Whole Blood Glucose POCT: 215 mg/dL — ABNORMAL HIGH (ref 70–100)
Whole Blood Glucose POCT: 247 mg/dL — ABNORMAL HIGH (ref 70–100)

## 2018-07-02 LAB — BASIC METABOLIC PANEL
Anion Gap: 10 (ref 5.0–15.0)
Anion Gap: 8 (ref 5.0–15.0)
BUN: 12 mg/dL (ref 9–28)
BUN: 13 mg/dL (ref 9–28)
CO2: 17 mEq/L — ABNORMAL LOW (ref 22–29)
CO2: 19 mEq/L — ABNORMAL LOW (ref 22–29)
Calcium: 7.6 mg/dL — ABNORMAL LOW (ref 8.5–10.5)
Calcium: 7.7 mg/dL — ABNORMAL LOW (ref 8.5–10.5)
Chloride: 107 mEq/L (ref 100–111)
Chloride: 109 mEq/L (ref 100–111)
Creatinine: 1.1 mg/dL (ref 0.7–1.3)
Creatinine: 1.1 mg/dL (ref 0.7–1.3)
Glucose: 151 mg/dL — ABNORMAL HIGH (ref 70–100)
Glucose: 182 mg/dL — ABNORMAL HIGH (ref 70–100)
Potassium: 3.4 mEq/L — ABNORMAL LOW (ref 3.5–5.1)
Potassium: 3.6 mEq/L (ref 3.5–5.1)
Sodium: 134 mEq/L — ABNORMAL LOW (ref 136–145)
Sodium: 136 mEq/L (ref 136–145)

## 2018-07-02 LAB — GFR
EGFR: >60
EGFR: >60

## 2018-07-02 LAB — MAGNESIUM: Magnesium: 2 mg/dL (ref 1.6–2.6)

## 2018-07-02 LAB — MRSA CULTURE
Culture MRSA Surveillance: NEGATIVE
Culture MRSA Surveillance: NEGATIVE

## 2018-07-02 LAB — PHOSPHORUS: Phosphorus: 2.6 mg/dL (ref 2.3–4.7)

## 2018-07-02 MED ORDER — DEXTROSE 10 % IV BOLUS
125.00 mL | INTRAVENOUS | Status: DC | PRN
Start: 2018-07-02 — End: 2018-07-03

## 2018-07-02 MED ORDER — INSULIN GLARGINE 100 UNIT/ML SC SOLN
25.00 [IU] | Freq: Two times a day (BID) | SUBCUTANEOUS | Status: DC
Start: 2018-07-02 — End: 2018-07-03
  Administered 2018-07-02 – 2018-07-03 (×3): 25 [IU] via SUBCUTANEOUS
  Filled 2018-07-02 (×4): qty 25

## 2018-07-02 MED ORDER — ALBUTEROL-IPRATROPIUM 2.5-0.5 (3) MG/3ML IN SOLN
3.00 mL | Freq: Three times a day (TID) | RESPIRATORY_TRACT | Status: DC
Start: 2018-07-03 — End: 2018-07-03
  Administered 2018-07-03 (×3): 3 mL via RESPIRATORY_TRACT
  Filled 2018-07-02 (×3): qty 3

## 2018-07-02 MED ORDER — INSULIN LISPRO 100 UNIT/ML SC SOLN
1.00 [IU] | Freq: Three times a day (TID) | SUBCUTANEOUS | Status: DC
Start: 2018-07-02 — End: 2018-07-03
  Administered 2018-07-02: 13:00:00 4 [IU] via SUBCUTANEOUS
  Filled 2018-07-02: qty 6
  Filled 2018-07-02: qty 12

## 2018-07-02 MED ORDER — INSULIN LISPRO 100 UNIT/ML SC SOLN
1.00 [IU] | Freq: Every evening | SUBCUTANEOUS | Status: DC
Start: 2018-07-02 — End: 2018-07-03
  Administered 2018-07-02: 22:00:00 1 [IU] via SUBCUTANEOUS
  Filled 2018-07-02: qty 3

## 2018-07-02 MED ORDER — ALBUTEROL-IPRATROPIUM 2.5-0.5 (3) MG/3ML IN SOLN
3.00 mL | RESPIRATORY_TRACT | Status: DC
Start: 2018-07-02 — End: 2018-07-02
  Administered 2018-07-02 (×2): 3 mL via RESPIRATORY_TRACT
  Filled 2018-07-02: qty 3

## 2018-07-02 MED ORDER — GLUCOSE 40 % PO GEL
15.00 g | ORAL | Status: DC | PRN
Start: 2018-07-02 — End: 2018-07-03

## 2018-07-02 MED ORDER — TRAMADOL HCL 50 MG PO TABS
50.0000 mg | ORAL_TABLET | Freq: Once | ORAL | Status: AC
Start: 2018-07-02 — End: 2018-07-02
  Administered 2018-07-02: 50 mg via ORAL
  Filled 2018-07-02: qty 1

## 2018-07-02 MED ORDER — POTASSIUM CHLORIDE CRYS ER 20 MEQ PO TBCR
40.00 meq | EXTENDED_RELEASE_TABLET | Freq: Once | ORAL | Status: AC
Start: 2018-07-02 — End: 2018-07-02
  Administered 2018-07-02: 06:00:00 40 meq via ORAL
  Filled 2018-07-02: qty 2

## 2018-07-02 MED ORDER — PLASMA-LYTE A IV INFUSION
INTRAVENOUS | Status: DC
Start: 2018-07-02 — End: 2018-07-02

## 2018-07-02 MED ORDER — GLUCAGON 1 MG IJ SOLR (WRAP)
1.00 mg | INTRAMUSCULAR | Status: DC | PRN
Start: 2018-07-02 — End: 2018-07-03

## 2018-07-02 NOTE — Progress Notes (Signed)
INTERNAL MEDICINE PROGRESS NOTE  [facility], Division of Hospitalist Medicine  [facility] [place]  [place] pager: [phone]      Date Time: 07/02/18 6:43 PM  Patient Name: [name] [name], [name] [name].  Attending Physician: Theresia Lo, MD    Assessment:   Active Problems:    DKA (diabetic ketoacidoses)  Resolved Problems:    * No resolved hospital problems. *      22 years old male with history of type 2 diabetes mellitus who had recently admitted for DKA presented to the hospital with 3 days history of malaise body ache poor appetite abdominal pain and low oral intake.  He was found to be in DKA and he was positive for influenza B.  He was admitted into ICU for DKA management    He takes 25 units of Lantus twice daily in addition to NovoLog sliding scale per carbohydrate count  Was diagnosed with type 1 diabetes mellitus at age 55, had a history of asthma and he smokes cigarettes    Today 1     Plan:       DKA-resolved anion gap closed  Type I diabetic mellitus  -IV fluids can be stopped that patient is eating  -Start Lantus 25 units twice daily  -Insulin sliding scale    Acute dehydration and acute kidney injury secondary to prerenal-resolved  Anion gap metabolic acidosis secondary to DKA-resolved  Pseudohyponatremia secondary to DKA-solved      Influenza B  Asthma with mild bronchospasm  She developed upper respiratory symptoms few days ago he also reports subjective fever.  He takes his albuterol as needed.  The hospital he was positive for influenza B and he was a started on Tamiflu 75 mg twice daily for 5 days  Continue albuterol every 8 hours        Deep vein thrombosis prophylaxis     Full Code  Diet consistent carbohydrate  Supervise For Meals Frequency: All meals    Data Unavailable   @DISPOSITION @        Case discussed with: .nurse  And pt   Subjective/24 hour events:     Chief Complaint   Patient presents with   . Hyperglycemia       Last 24 h : no event   Today : . LOS: 1 day  has muscle pain ,  requesting some thing stronger than tylenol , takes percocet at home PRN     Safety checklist:   DVT prophylaxis: .  Foley catheter:None   IV access: Peripheral   PT/OT:   Daily labs : Ordered    Medications:   albuterol-ipratropium, 3 mL, Q4H SCH  fluticasone furoate-vilanterol, 1 puff, QAM  heparin (porcine), 5,000 Units, Q8H SCH  insulin glargine, 25 Units, Q12H  insulin lispro, 1-3 Units, QHS  insulin lispro, 1-5 Units, TID AC  oseltamivir, 75 mg, Q12H SCH      Current Facility-Administered Medications   Medication Dose Route   . acetaminophen  650 mg Oral   . dextrose  125 mL Intravenous   . dextrose  125 mL Intravenous   . dextrose  15 g of glucose Oral    And   . glucagon (rDNA)  1 mg Intramuscular    And   . dextrose  125 mL Intravenous   . ondansetron  4 mg Intravenous   . senna  8.8 mg Oral     Physical exam:   Temp:  [98 F (36.7 C)-99.9 F (37.7 C)] 99.1 F (37.3  C)  Heart Rate:  [84-114] 111  Resp Rate:  [14-29] 18  BP: (101-156)/(52-92) 136/88    Intake/Output Summary (Last 24 hours) at 07/02/2018 1843  Last data filed at 07/02/2018 1728  Gross per 24 hour   Intake 2246.63 ml   Output 1750 ml   Net 496.63 ml     General: Awake, alert, oriented, no apparent distress.  Cardiovascular: Regular rate and rhythm. No murmurs, gallops or rubs noted.  Lungs: Clear to auscultation bilaterally. No wheezing, crackles or rhonchi noted.  Abdomen: Soft, non-tender, non-distended. No organomegaly or masses noted. Normal bowel sounds. No guarding or rebound tenderness noted.  Extremities: No edema noted. 2+ pulses throughout.  Neuro: Non-focal neurological exam.  Other: Tattoos       Labs (last 72 hours):     Recent Labs   Lab 07/02/18  0415 07/01/18  1327   WBC 6.32 8.11   Hgb 12.9 15.0   Hematocrit 39.5 47.2   Platelets 184 152     Recent Labs   Lab 07/02/18  0415 07/02/18  0013  07/01/18  1327   Sodium 136 134*  More results in Results Review 132*   Potassium 3.4* 3.6  More results in Results Review 5.4*    Chloride 109 107  More results in Results Review 93*   CO2 19* 17*  More results in Results Review 11*   BUN 12 13  More results in Results Review 16   Creatinine 1.1 1.1  More results in Results Review 1.6*   Calcium 7.6* 7.7*  More results in Results Review 9.2   Albumin  --   --   --  4.1   Protein, Total  --   --   --  7.5   Bilirubin, Total  --   --   --  0.7   Alkaline Phosphatase  --   --   --  106   ALT  --   --   --  24   AST (SGOT)  --   --   --  45*   Glucose 182* 151*  More results in Results Review 473*   More results in Results Review = values in this interval not displayed.           Radiology:     Radiology Results (24 Hour)     Procedure Component Value Units Date/Time    XR Chest 2 Views [[id number]] Collected:  07/02/18 1031    Order Status:  Completed Updated:  07/02/18 1037    Narrative:       History: influenza    Technique: PA and Lateral    Comparison: None.    Findings:  There is slight blunting of the left costophrenic angle with minimal  linear airspace disease seen in the lingula  There is no pneumothorax.  The heart is normal in size.    The mediastinum is within normal limits.    There is a slight thoracic curvature of the spine convex right      Impression:          1. Minimal lingular airspace disease which may represent mild focal  scarring or pneumonitis  2. Slight blunting of left costophrenic angle which may indicate a trace  left pleural effusion or pleural thickening/scarring    Laurena Slimmer, MD   07/02/2018 10:33 AM          Signed by: Theresia Lo, MD  Date/time: 07/02/18 6:43 PM

## 2018-07-02 NOTE — Progress Notes (Signed)
Report given to 3B charge nurse and pt transferred to 330-2 via w/c in stable condition with all belongings. Pt denies any sob and no s/s acute distress noted. Care transferred.

## 2018-07-02 NOTE — Plan of Care (Signed)
Problem: Safety  Goal: Patient will be free from injury during hospitalization  Outcome: Progressing  Flowsheets (Taken 07/02/2018 0233)  Patient will be free from injury during hospitalization : Assess patient's risk for falls and implement fall prevention plan of care per policy; Include patient/ family/ care giver in decisions related to safety; Provide and maintain safe environment; Use appropriate transfer methods; Hourly rounding; Ensure appropriate safety devices are available at the bedside; Assess for patients risk for elopement and implement Elopement Risk Plan per policy; Provide alternative method of communication if needed (communication boards, writing)  Note:   Pt  appropriately aware of safety. Bed at the lowest position.  Bed alarm on.  Hourly rounding in place. Call bell within reach.      Problem: Pain  Goal: Pain at adequate level as identified by patient  Outcome: Progressing  Flowsheets (Taken 07/02/2018 0233)  Pain at adequate level as identified by patient: Identify patient comfort function goal; Reassess pain within 30-60 minutes of any procedure/intervention, per Pain Assessment, Intervention, Reassessment (AIR) Cycle; Evaluate if patient comfort function goal is met; Offer non-pharmacological pain management interventions; Assess for risk of opioid induced respiratory depression, including snoring/sleep apnea. Alert healthcare team of risk factors identified.; Assess pain on admission, during daily assessment and/or before any "as needed" intervention(s)  Note:   Pt C/O headache and abd pain. Headache relieve tylenol.  Pt continues to c/o abd pain with movement. EICU  made aware. Recommenced Non pharmacological intervention. Educate pt about pain non pharmacological pain intervention.      Problem: Fluid and Electrolyte Imbalance/ Endocrine  Goal: Fluid and electrolyte balance are achieved/maintained  Outcome: Progressing  Flowsheets (Taken 07/02/2018 0233)  Fluid and electrolyte balance  are achieved/maintained: Monitor intake and output every shift; Monitor/assess lab values and report abnormal values; Provide adequate hydration; Assess and reassess fluid and electrolyte status; Assess for confusion/personality changes  Note:   Pt on insulin gtt.  Following DKA protocol.Glucose checks Q hour. Pt on DKA protocol. Iv D5 NS going at 150 mL/hr. Monitoring BMP. Will continue to monitor pt.

## 2018-07-02 NOTE — Progress Notes (Signed)
Pt received from CCU in room no 330-02,alert and oriented x 4,oriented the pt to room and call light,refused to take insulin now,as he has not eat his dinner yet,notified to pharmacist and lantus time changed to 2100.

## 2018-07-02 NOTE — UM Notes (Addendum)
Inpatient review; ICU admission    HPI 07/01/18  "[name] [name]. is a 22 y.o. male with hx of T1DM (dx at age of 55), recent admission in NOV 2019 for DKA, does not follow any physician on regular basis, asthma and smoker, with insulin regimen of 25u Lantus BID and Novolog sliding scale per carbohydrate count who presented today with 3-days of malaise, body aches, poor appetite, stomach aches, no po intake x 3 days (although reports has been taking his lantus 25u BID), found to have DKA and +influenza B.  He reports subjective fevers and sweats over last day.     He is not on any maintenance inhalers for underlying asthma.  Does take albuterol MDI on PRN basis (last use was 2 days ago).  Denies subjective wheezes or respiratory distress.  Denies sick contacts.  Denies sputum production.  Mild URI symptoms/congestion.     BP 108/55   Pulse 90   Temp 98.9 F (37.2 C) (Oral)   Resp 22   SpO2 100%     Glucose : 473, Crt: 1.6, Sodium: 132,CO2: 11,Anion gap: 28    In the ED:  He got 2L of IVF and saline maintenance IVF at 200 cc/h.  Started on IV insulin drip per DKA protocol after 5u of IV insulin and also got one dose of toradol.  In addition, he got a dose of Tamiflu."    "  Main ICU Plans:     Neuro:   - stable, monitor.      Respiratory:   - Maintain O2 sat>94%  - albuterol nebs q8  - Start Breo Ellipta  - Tamiflu 75 mg BID x 5 days  - If symptoms suggestive of lower respiratory tract infection (viral PNA or bacterial superinfection), will do CXR.   - Smoking cessation      Cardiovascular:   - MAP goal>65  - Stable, monitor      GI:   - Bowel regimen: n/a.   - Nutrition: NPO/ sips and water OK.  If want to eat a meal while on DKA protocol, would add 4 units of IV insulin bolus prior to meals.   Otherwise transition to diabetic diet once out of DKA picture.   - GI ppx: n/a.       Renal: Mild AKI in setting of DKA/volume depletion/viral infection/poor appetite.   - Monitor UOP, strict I/Os,  trend BUN/Cr, daily weights  - Monitor and correct lytes prn  - Maintenance IVF: Saline infusion  - Foley: n/a.       Hematology:   - No signs of active bleeding  - Trend H/H, plts, coags  - DVT ppx: SCDs and sqh      ID: Rapid flu swab +Inflenza B.  - Tramiflu x 5 days as per "respiratory" section      Endo: DKA (IDDM), multiple admissions.   - IVF hydration  - IV insulin gtt.  - Monitor electrolyte and replete as needed.  - Once BS < 250 and if AG still open, switch saline infusion to dextrose and continue along with insulin gtt until AG closes per protocol..."      Admit to Inpatient (Order [id number])   Admission   Date: 07/01/2018 at [facility] day #2, 07/02/18     Per RN, at 0249 hours  "Pt on insulin gtt.  Following DKA protocol.Glucose checks Q hour. Pt on DKA protocol. Iv D5 NS going at  150 mL/hr. Monitoring BMP. Will continue to monitor pt."     May wean off insulin gtt to SSI coverage today    VS relatively stable   CO2 between 17-19   Glucose;262-182

## 2018-07-02 NOTE — Progress Notes (Signed)
Critical Care Attending Note: [name] Brazen, MD     [name] [name]  Critical Care Admission Note   Note Date: 07/02/2018  Note Time: 9:39 AM    Patient: [name] [name].  MRN: [id number]  Age/Sex: 22 y.o. male  DOB: 11-Jan-1996  Code Status: Full Code  Date of Admission: 07/01/2018       Brief Historical Review:   [name] [name]. is a 22 y.o. male with hx of T1DM (dx at age of 22), recent admission in NOV 2019 for DKA, does not follow any physician on regular basis, asthma and smoker, with insulin regimen of 25u Lantus BID and Novolog sliding scale per carbohydrate count who presented today with 3-days of malaise, body aches, poor appetite, stomach aches, no po intake x 3 days (although reports has been taking his lantus 25u BID), found to have DKA and +influenza B.  He reports subjective fevers and sweats over last day.     He is not on any maintenance inhalers for underlying asthma.  Does take albuterol MDI on PRN basis (last use was 2 days ago).  Denies subjective wheezes or respiratory distress.  Denies sick contacts.  Denies sputum production.  Mild URI symptoms/congestion.       In the ED:  He got 2L of IVF and saline maintenance IVF at 200 cc/h.  Started on IV insulin drip per DKA protocol after 5u of IV insulin and also got one dose of toradol.  In addition, he got a dose of Tamiflu.     After transfer to ICU from ED:  Asking to drink water.  Denies N/V.  Remains on RA.        06/22/18:    AG closed this AM. Transitioned to [name] [name] this AM.  Dry cough.  No nausea.  Tolerating oral intake.  No issues reported overnight.  AKI resolved.   No documented fevers overnight.  No leukocytosis.      Physical Exam:     Vital Signs:  BP 113/60   Pulse 92   Temp 98 F (36.7 C) (Oral)   Resp 18   Ht 1.727 m (5\' 8" )   Wt 104 kg (229 lb 4.5 oz)   SpO2 97%   BMI 34.86 kg/m     GENERAL: Alert, awake, cooperative  HEENT: non-icteric, no pallor.   NECK: supple.   LUNGS: +wheezes on forced exhalation and  cough.   HEART: RRR (sinus on the monitor).   ABD: soft, NT, ND.   EXT: no edema.  NEURO: MAE x 4.  Grossly non-focal.   Skin: no rashes.   Catheters/Drains: none    Ventilator Settings:        Radiologic Images Reviewed:     None this admission.    Input / Output:              Intake/Output Summary (Last 24 hours) at 07/02/2018 0939  Last data filed at 07/02/2018 0700  Gross per 24 hour   Intake 1846.63 ml   Output -   Net 1846.63 ml      Critical Care Issues:     1. DKA  2. Influenza B  3. Type 1 diabetes (dx at age of 15).  4. Smoker (on and off)  5. Asthma with mild bronchospasm (not on chronic maintenance inhaler)  6. AKI  7. Dehydration/volume depletion  8. AGMA due to DKA  9. Pseudohyponatremia    Main ICU Plans:     Neuro:   -  stable, monitor.      Respiratory:   - Maintain O2 sat>94%  - Switched albuterol nebs q8 to duonebs q4 today 12/29  - Continue Breo Ellipta (has been prescribed for home use but has not taken it).   - Continue Tamiflu 75 mg BID x 5 days  - Will order CXR PA/LAT.   - Smoking cessation      Cardiovascular:   - MAP goal>65  - Stable, monitor      GI:   - Bowel regimen: n/a.   - Nutrition: Diabetic diet  - GI ppx: n/a.       Renal: Mild AKI in setting of DKA/volume depletion/viral infection/poor appetite on admission (resolved).  - Monitor UOP, strict I/Os, trend BUN/Cr, daily weights  - Monitor and correct lytes prn  - Maintenance IVF: plasmalyte infusion x 12 additional hrs with re-evaluation for further IVF afterwards.   - Foley: n/a.       Hematology:   - No signs of active bleeding  - Trend H/H, plts, coags  - DVT ppx: SCDs and sqh      ID: Rapid flu swab +Inflenza B.  - Tramiflu x 5 days as per "respiratory" section      Endo: DKA (IDDM), multiple admissions.   - IVF hydration  - Switched to [facility] (home regimen) this AM 12/29 after AG closed overnight.   - Monitor electrolyte and replete as needed.  - HgA1c: 12.8 (suspect non-compliance in addition to current admission with DKA  triggered by influenza).   - Will need to establish outpatient PCP/endocrine follow up (aware of it based on discussion with patient on 12/28 and prior admission in Nov 2019).       Dispo: admitted to ICU 12/28 for DKA management.  Transfer to medicine today 12/29.     Family: plan of care d/w patient.        Line, Drains,Airway:   Patient Lines/Drains/Airways Status    Active PICC Line / CVC Line / PIV Line / Drain / Airway / Intraosseous Line / Epidural Line / ART Line / Line / Wound / Pressure Ulcer / NG/OG Tube     Name:   Placement date:   Placement time:   Site:   Days:    Peripheral IV 07/01/18 Left Antecubital   07/01/18    1326    Antecubital   less than 1                [name] Problem List:  Active [name] Problems    Diagnosis   . DKA (diabetic ketoacidoses)     Past Medical/Surgical History:  Past Medical History:   Diagnosis Date   . Diabetes mellitus    . Seasonal asthma     History reviewed. No pertinent surgical history.   Allergies:  He is allergic to cephalosporins and gantrisin [sulfisoxazole].    Home Medications:  The following is a compilation of best available information at the time of admission.  Other sources may be utilized to further obtain medication histories and/or to clarify conflicting information, if applicable.   No current facility-administered medications on file prior to encounter.      Current Outpatient Medications on File Prior to Encounter   Medication Sig Dispense Refill   . ACCU-CHEK FASTCLIX LANCETS Misc Check blood sugar 7 times daily, as directed by MD. 204 each 11   . albuterol (PROVENTIL HFA;VENTOLIN HFA) 108 (90 Base) MCG/ACT inhaler Inhale 2 puffs into the lungs every 6 (six)  hours as needed for Wheezing or Shortness of Breath 18 g 0   . fluticasone furoate-vilanterol (BREO ELLIPTA) 200-25 MCG/INH Aerosol Powder, Breath Activtivatede Inhale 1 puff into the lungs daily 28 each 0   . glucagon (GLUCAGON EMERGENCY) 1 MG injection Inject 1 mg into the skin once as  needed (hypoglycemia less than 50) 1 kit 0   . glucose blood (ACCU-CHEK AVIVA PLUS) test strip Check blood sugar 7 times daily, as directed by MD. 200 each 11   . insulin glargine (LANTUS) 100 UNIT/ML injection Inject 25 Units into the skin every 12 (twelve) hours 1 pen 0   . insulin lispro (HUMALOG) 100 UNIT/ML injection Administer via carb count protocal 1 pen 3       Family History:  History reviewed. No pertinent family history.  Social History:  Social History     Socioeconomic History   . Marital status: Single     Spouse name: Not on file   . Number of children: Not on file   . Years of education: Not on file   . Highest education level: Not on file   Occupational History   . Not on file   Social Needs   . Financial resource strain: Not on file   . Food insecurity:     Worry: Not on file     Inability: Not on file   . Transportation needs:     Medical: Not on file     Non-medical: Not on file   Tobacco Use   . Smoking status: Current Some Day Smoker     Types: Cigarettes   . Smokeless tobacco: Never Used   Substance and Sexual Activity   . Alcohol use: Yes     Comment: socially    . Drug use: Yes     Comment: marijuana a couple times a week    . Sexual activity: Not on file   Lifestyle   . Physical activity:     Days per week: Not on file     Minutes per session: Not on file   . Stress: Not on file   Relationships   . Social connections:     Talks on phone: Not on file     Gets together: Not on file     Attends religious service: Not on file     Active member of club or organization: Not on file     Attends meetings of clubs or organizations: Not on file     Relationship status: Not on file   . Intimate partner violence:     Fear of current or ex partner: Not on file     Emotionally abused: Not on file     Physically abused: Not on file     Forced sexual activity: Not on file   Other Topics Concern   . Not on file   Social History Narrative   . Not on file       Review of Systems:  10 point ROS was reviewed  with the patient and pertinent findings are noted in the HPI.  ROS is otherwise negative or unobtainable due to acuity of illness.      SCHEDULED MEDICATIONS: PRN MEDS:   Current Facility-Administered Medications   Medication Dose Route Frequency   . albuterol-ipratropium  3 mL Nebulization Q4H SCH   . fluticasone furoate-vilanterol  1 puff Inhalation QAM   . heparin (porcine)  5,000 Units Subcutaneous Q8H SCH   . insulin glargine  25 Units Subcutaneous Q12H   . insulin lispro  1-3 Units Subcutaneous QHS   . insulin lispro  1-5 Units Subcutaneous TID AC   . oseltamivir  75 mg Oral Q12H SCH    acetaminophen, dextrose, dextrose, Nursing communication: Adult Hypoglycemia Treatment Algorithm **AND** dextrose **AND** dextrose **AND** glucagon (rDNA), ondansetron, senna   INFUSION MEDS:    . Plasma-Lyte A 75 mL/hr at 07/02/18 0938         Labs Reviewed:  Results     Procedure Component Value Units Date/Time    Glucose Whole Blood - POCT [[id number]]  (Abnormal) Collected:  07/02/18 0807     Updated:  07/02/18 0811     POCT - Glucose Whole blood 168 mg/dL     Glucose Whole Blood - POCT [[id number]]  (Abnormal) Collected:  07/02/18 0702     Updated:  07/02/18 0704     POCT - Glucose Whole blood 166 mg/dL     Phosphorus [[id number]] Collected:  07/02/18 0415    Specimen:  Blood Updated:  07/02/18 0532     Phosphorus 2.6 mg/dL     GFR [[id number]] Collected:  07/02/18 0415     Updated:  07/02/18 0532     EGFR >60.0    Basic Metabolic Panel [[id number]]  (Abnormal) Collected:  07/02/18 0415    Specimen:  Blood Updated:  07/02/18 0532     Glucose 182 mg/dL      BUN 12 mg/dL      Creatinine 1.1 mg/dL      Calcium 7.6 mg/dL      Sodium 244 mEq/L      Potassium 3.4 mEq/L      Chloride 109 mEq/L      CO2 19 mEq/L      Anion Gap 8.0    Magnesium [[id number]] Collected:  07/02/18 0415    Specimen:  Blood Updated:  07/02/18 0532     Magnesium 2.0 mg/dL     CBC without differential [[id number]] Collected:  07/02/18 0415    Specimen:  Blood  Updated:  07/02/18 0511     WBC 6.32 x10 3/uL      Hgb 12.9 g/dL      Hematocrit 59.5 %      Platelets 184 x10 3/uL      RBC 4.98 x10 6/uL      MCV 79.3 fL      MCH 25.9 pg      MCHC 32.7 g/dL      RDW 13 %      MPV 11.6 fL      Nucleated RBC 0.0 /100 WBC      Absolute NRBC 0.00 x10 3/uL     Glucose Whole Blood - POCT [[id number]]  (Abnormal) Collected:  07/02/18 0458     Updated:  07/02/18 0501     POCT - Glucose Whole blood 178 mg/dL     Glucose Whole Blood - POCT [[id number]]  (Abnormal) Collected:  07/02/18 0410     Updated:  07/02/18 0419     POCT - Glucose Whole blood 202 mg/dL     Glucose Whole Blood - POCT [[id number]]  (Abnormal) Collected:  07/02/18 0314     Updated:  07/02/18 0317     POCT - Glucose Whole blood 173 mg/dL     Glucose Whole Blood - POCT [[id number]]  (Abnormal) Collected:  07/02/18 0204     Updated:  07/02/18 0209     POCT - Glucose Whole blood 160 mg/dL  Glucose Whole Blood - POCT [[id number]]  (Abnormal) Collected:  07/02/18 0106     Updated:  07/02/18 0110     POCT - Glucose Whole blood 158 mg/dL     GFR [[id number]] Collected:  07/02/18 0013     Updated:  07/02/18 0055     EGFR >60.0    Narrative:       To begin when Anion Gap is less than 12.    Basic Metabolic Panel [[id number]]  (Abnormal) Collected:  07/02/18 0013    Specimen:  Blood Updated:  07/02/18 0055     Glucose 151 mg/dL      BUN 13 mg/dL      Creatinine 1.1 mg/dL      Calcium 7.7 mg/dL      Sodium 086 mEq/L      Potassium 3.6 mEq/L      Chloride 107 mEq/L      CO2 17 mEq/L      Anion Gap 10.0    Narrative:       To begin when Anion Gap is less than 12.    Glucose Whole Blood - POCT [[id number]]  (Abnormal) Collected:  07/01/18 2358     Updated:  07/02/18 0007     POCT - Glucose Whole blood 158 mg/dL     MRSA culture - Nares (If not done in triage) [[id number]] Collected:  07/01/18 1721    Specimen:  Body Fluid from Nares Updated:  07/01/18 2314    MRSA culture - Throat (If not done in triage) [[id number]] Collected:  07/01/18 1721     Specimen:  Body Fluid from Throat Updated:  07/01/18 2314    Glucose Whole Blood - POCT [[id number]]  (Abnormal) Collected:  07/01/18 2255     Updated:  07/01/18 2257     POCT - Glucose Whole blood 247 mg/dL     Glucose Whole Blood - POCT [[id number]]  (Abnormal) Collected:  07/01/18 2203     Updated:  07/01/18 2208     POCT - Glucose Whole blood 297 mg/dL     Hemoglobin E3P [[id number]]  (Abnormal) Collected:  07/01/18 1327    Specimen:  Blood Updated:  07/01/18 2141     Hemoglobin A1C 12.8 %      Average Estimated Glucose 320.7 mg/dL     Narrative:       This is NOT the correct Test for Patients with  Hemoglobinopathy.    Basic Metabolic Panel [[id number]]  (Abnormal) Collected:  07/01/18 2002    Specimen:  Blood Updated:  07/01/18 2108     Glucose 313 mg/dL      BUN 15 mg/dL      Creatinine 1.1 mg/dL      Calcium 7.7 mg/dL      Sodium 093 mEq/L      Potassium 5.0 mEq/L      Chloride 102 mEq/L      CO2 18 mEq/L      Anion Gap 11.0    Narrative:       To begin when Anion Gap is less than 12.    Magnesium [[id number]] Collected:  07/01/18 2002    Specimen:  Blood Updated:  07/01/18 2108     Magnesium 2.6 mg/dL     Narrative:       To begin when Anion Gap is less than 12.    Phosphorus [[id number]] Collected:  07/01/18 2002    Specimen:  Blood Updated:  07/01/18 2108     Phosphorus 2.9 mg/dL  Narrative:       To begin when Anion Gap is less than 12.    GFR [[id number]] Collected:  07/01/18 2002     Updated:  07/01/18 2108     EGFR >60.0    Narrative:       To begin when Anion Gap is less than 12.    Glucose Whole Blood - POCT [[id number]]  (Abnormal) Collected:  07/01/18 2100     Updated:  07/01/18 2103     POCT - Glucose Whole blood 335 mg/dL     Glucose Whole Blood - POCT [[id number]]  (Abnormal) Collected:  07/01/18 1955     Updated:  07/01/18 1958     POCT - Glucose Whole blood 284 mg/dL     Glucose Whole Blood - POCT [[id number]]  (Abnormal) Collected:  07/01/18 1902     Updated:  07/01/18 1908     POCT - Glucose  Whole blood 265 mg/dL     Basic Metabolic Panel [[id number]]  (Abnormal) Collected:  07/01/18 1622    Specimen:  Blood Updated:  07/01/18 1848     Glucose 262 mg/dL      BUN 17 mg/dL      Creatinine 1.3 mg/dL      Calcium 7.6 mg/dL      Sodium 323 mEq/L      Potassium 3.6 mEq/L      Chloride 102 mEq/L      CO2 14 mEq/L      Anion Gap 18.0    Narrative:       To begin when Anion Gap is less than 12.    Magnesium [[id number]] Collected:  07/01/18 1622    Specimen:  Blood Updated:  07/01/18 1848     Magnesium 1.8 mg/dL     Narrative:       To begin when Anion Gap is less than 12.    Phosphorus [[id number]] Collected:  07/01/18 1622    Specimen:  Blood Updated:  07/01/18 1848     Phosphorus 3.8 mg/dL     Narrative:       To begin when Anion Gap is less than 12.    GFR [[id number]] Collected:  07/01/18 1622     Updated:  07/01/18 1848     EGFR >60.0    Narrative:       To begin when Anion Gap is less than 12.    Glucose Whole Blood - POCT [[id number]]  (Abnormal) Collected:  07/01/18 1808     Updated:  07/01/18 1813     POCT - Glucose Whole blood 242 mg/dL     Glucose Whole Blood - POCT [[id number]]  (Abnormal) Collected:  07/01/18 1703     Updated:  07/01/18 1706     POCT - Glucose Whole blood 233 mg/dL     Phosphorus [[id number]] Collected:  07/01/18 1622    Specimen:  Blood Updated:  07/01/18 1703     Phosphorus 3.8 mg/dL     Magnesium [[id number]] Collected:  07/01/18 1622    Specimen:  Blood Updated:  07/01/18 1703     Magnesium 1.9 mg/dL     Glucose Whole Blood - POCT [[id number]]  (Abnormal) Collected:  07/01/18 1549     Updated:  07/01/18 1553     POCT - Glucose Whole blood 317 mg/dL     Glucose Whole Blood - POCT [[id number]]  (Abnormal) Collected:  07/01/18 1436     Updated:  07/01/18 1439     POCT - Glucose  Whole blood 426 mg/dL     Comprehensive metabolic panel [[id number]]  (Abnormal) Collected:  07/01/18 1327    Specimen:  Blood Updated:  07/01/18 1408     Glucose 473 mg/dL      BUN 16 mg/dL      Creatinine 1.6 mg/dL       Sodium 010 mEq/L      Potassium 5.4 mEq/L      Chloride 93 mEq/L      CO2 11 mEq/L      Calcium 9.2 mg/dL      Protein, Total 7.5 g/dL      Albumin 4.1 g/dL      AST (SGOT) 45 U/L      ALT 24 U/L      Alkaline Phosphatase 106 U/L      Bilirubin, Total 0.7 mg/dL      Globulin 3.4 g/dL      Albumin/Globulin Ratio 1.2     Anion Gap 28.0    Lipase [[id number]]  (Abnormal) Collected:  07/01/18 1327    Specimen:  Blood Updated:  07/01/18 1408     Lipase <4 U/L     Magnesium [[id number]] Collected:  07/01/18 1327    Specimen:  Blood Updated:  07/01/18 1408     Magnesium 1.7 mg/dL     GFR [[id number]] Collected:  07/01/18 1327     Updated:  07/01/18 1408     EGFR >60.0    UA  with reflex to micro (pts  3 + yrs) [[id number]]  (Abnormal) Collected:  07/01/18 1353    Specimen:  Urine Updated:  07/01/18 1408     Urine Type Clean Catch     Color, UA Colorless     Clarity, UA Clear     Specific Gravity UA 1.028     Urine pH 6.0     Leukocyte Esterase, UA Negative     Nitrite, UA Negative     Protein, UR 30     Glucose, UA >=500     Ketones UA 80     Urobilinogen, UA Negative mg/dL      Bilirubin, UA Negative     Blood, UA Negative     RBC, UA 0 - 2 /hpf     Rapid influenza A/B antigens [[id number]] Collected:  07/01/18 1327    Specimen:  Nasopharyngeal from Nasal Aspirate Updated:  07/01/18 1358    Narrative:       ORDER#: [id number]                                    ORDERED BY: Deirdre Pippins  SOURCE: Nasal Aspirate                               COLLECTED:  07/01/18 13:27  ANTIBIOTICS AT COLL.:                                RECEIVED :  07/01/18 13:32  Influenza Rapid Antigen A&B                FINAL       07/01/18 13:58   +  07/01/18   Positive for Influenza B and Negative for Influenza A             Reference Range: Negative  CBC and differential [[id number]]  (Abnormal) Collected:  07/01/18 1327    Specimen:  Blood Updated:  07/01/18 1337     WBC 8.11 x10 3/uL      Hgb 15.0 g/dL      Hematocrit 62.1 %      Platelets 152  x10 3/uL      RBC 5.80 x10 6/uL      MCV 81.4 fL      MCH 25.9 pg      MCHC 31.8 g/dL      RDW 13 %      MPV 10.7 fL      Neutrophils 74.1 %      Lymphocytes Automated 13.8 %      Monocytes 10.6 %      Eosinophils Automated 0.0 %      Basophils Automated 0.9 %      Immature Granulocyte 0.6 %      Nucleated RBC 0.0 /100 WBC      Neutrophils Absolute 6.01 x10 3/uL      Abs Lymph Automated 1.12 x10 3/uL      Abs Mono Automated 0.86 x10 3/uL      Abs Eos Automated 0.00 x10 3/uL      Absolute Baso Automated 0.07 x10 3/uL      Absolute Immature Granulocyte 0.05 x10 3/uL      Absolute NRBC 0.00 x10 3/uL     Glucose Whole Blood - POCT [[id number]]  (Abnormal) Collected:  07/01/18 1308     Updated:  07/01/18 1310     POCT - Glucose Whole blood 478 mg/dL         Microbiology:  Microbiology Results     Procedure Component Value Units Date/Time    Rapid influenza A/B antigens [[id number]] Collected:  07/01/18 1327    Specimen:  Nasopharyngeal from Nasal Aspirate Updated:  07/01/18 1358    Narrative:       ORDER#: [id number]                                    ORDERED BY: Deirdre Pippins  SOURCE: Nasal Aspirate                               COLLECTED:  07/01/18 13:27  ANTIBIOTICS AT COLL.:                                RECEIVED :  07/01/18 13:32  Influenza Rapid Antigen A&B                FINAL       07/01/18 13:58   +  07/01/18   Positive for Influenza B and Negative for Influenza A             Reference Range: Negative          Radiologic Image Reports Reviewed:  No orders to display       I have reviewed relevant laboratory results, microbiology results, consultation notes, outside records, pathology results and other progress notes which may not be part of this note.  Radiology images and all laboratory data were independently reviewed in the EHR.       [name] Brazen, MD  07/02/18;  9:39 AM

## 2018-07-02 NOTE — Plan of Care (Signed)
Problem: Safety  Goal: Patient will be free from injury during hospitalization  Outcome: Progressing  Flowsheets (Taken 07/02/2018 0233 by Louanna Raw, RN)  Patient will be free from injury during hospitalization : Assess patient's risk for falls and implement fall prevention plan of care per policy;Include patient/ family/ care giver in decisions related to safety;Provide and maintain safe environment;Use appropriate transfer methods;Hourly rounding;Ensure appropriate safety devices are available at the bedside;Assess for patients risk for elopement and implement Elopement Risk Plan per policy;Provide alternative method of communication if needed (communication boards, writing)  Note:   Bed kept locked and low, bed alarm on, non-slip socks on, and call light kept within reach. Pt has made no unsafe attempts to get oob this shift and verbalized understanding of safety precautions and use of call light for all needs this shift.   Goal: Patient will be free from infection during hospitalization  Outcome: Progressing  Flowsheets (Taken 07/01/2018 1831 by Caryl Bis, RN)  Free from Infection during hospitalization: Assess and monitor for signs and symptoms of infection;Monitor lab/diagnostic results;Monitor all insertion sites (i.e. indwelling lines, tubes, urinary catheters, and drains);Encourage patient and family to use good hand hygiene technique  Note:   Peripheral IV's clean dry and intact without redness or drainage. Pt with temperature from 99-99.9, wctm for temperature trend. Scheduled tamiflu administered as ordered. Pt verbalized understanding of infection prevention education provided today.       Problem: Pain  Goal: Pain at adequate level as identified by patient  Outcome: Progressing  Flowsheets (Taken 07/02/2018 0233 by Louanna Raw, RN)  Pain at adequate level as identified by patient: Identify patient comfort function goal;Reassess pain within 30-60 minutes of any  procedure/intervention, per Pain Assessment, Intervention, Reassessment (AIR) Cycle;Evaluate if patient comfort function goal is met;Offer non-pharmacological pain management interventions;Assess for risk of opioid induced respiratory depression, including snoring/sleep apnea. Alert healthcare team of risk factors identified.;Assess pain on admission, during daily assessment and/or before any "as needed" intervention(s)  Note:   Pt has c/o generalized pain. Tylenol given once for elevated temperature and pain due to coughing. Pt also given prn zofran due to c/o nausea and loss of appetite.      Problem: Side Effects from Pain Analgesia  Goal: Patient will experience minimal side effects of analgesic therapy  Outcome: Progressing  Flowsheets (Taken 07/02/2018 1631)  Patient will experience minimal side effects of analgesic therapy: Monitor/assess patient's respiratory status (RR depth, effort, breath sounds)  Note:   No respiratory distress noted this shift, no analgesic administered       Problem: Fluid and Electrolyte Imbalance/ Endocrine  Goal: Fluid and electrolyte balance are achieved/maintained  Outcome: Progressing  Flowsheets (Taken 07/02/2018 0233 by Louanna Raw, RN)  Fluid and electrolyte balance are achieved/maintained: Monitor intake and output every shift;Monitor/assess lab values and report abnormal values;Provide adequate hydration;Assess and reassess fluid and electrolyte status;Assess for confusion/personality changes  Note:   IVF infusing well. Pt has adequate PO intake and denies n/v/d. Good urine output noted per urinal, see flow sheet for net I/O's. Monitoring BG AC/HS and sliding scale insulin given as indicated with meals. Smoking cessation reinforced. Nebs given per RT.

## 2018-07-03 DIAGNOSIS — J45909 Unspecified asthma, uncomplicated: Secondary | ICD-10-CM

## 2018-07-03 DIAGNOSIS — N179 Acute kidney failure, unspecified: Secondary | ICD-10-CM

## 2018-07-03 DIAGNOSIS — E860 Dehydration: Secondary | ICD-10-CM

## 2018-07-03 DIAGNOSIS — E872 Acidosis: Secondary | ICD-10-CM

## 2018-07-03 DIAGNOSIS — E1010 Type 1 diabetes mellitus with ketoacidosis without coma: Secondary | ICD-10-CM

## 2018-07-03 LAB — BASIC METABOLIC PANEL
Anion Gap: 8 (ref 5.0–15.0)
Anion Gap: 9 (ref 5.0–15.0)
BUN: 4 mg/dL — ABNORMAL LOW (ref 9–28)
BUN: 3 mg/dL — ABNORMAL LOW (ref 9–28)
CO2: 23 mEq/L (ref 22–29)
CO2: 25 mEq/L (ref 22–29)
Calcium: 8 mg/dL — ABNORMAL LOW (ref 8.5–10.5)
Calcium: 8.9 mg/dL (ref 8.5–10.5)
Chloride: 105 mEq/L (ref 100–111)
Chloride: 104 mEq/L (ref 100–111)
Creatinine: 0.8 mg/dL (ref 0.7–1.3)
Creatinine: 0.9 mg/dL (ref 0.7–1.3)
Glucose: 47 mg/dL — CL (ref 70–100)
Glucose: 105 mg/dL — ABNORMAL HIGH (ref 70–100)
Potassium: 3.2 mEq/L — ABNORMAL LOW (ref 3.5–5.1)
Potassium: 4 mEq/L (ref 3.5–5.1)
Sodium: 136 mEq/L (ref 136–145)
Sodium: 138 mEq/L (ref 136–145)

## 2018-07-03 LAB — PHOSPHORUS: Phosphorus: 3.5 mg/dL (ref 2.3–4.7)

## 2018-07-03 LAB — GLUCOSE WHOLE BLOOD - POCT
Whole Blood Glucose POCT: 125 mg/dL — ABNORMAL HIGH (ref 70–100)
Whole Blood Glucose POCT: 177 mg/dL — ABNORMAL HIGH (ref 70–100)
Whole Blood Glucose POCT: 42 mg/dL — CL (ref 70–100)
Whole Blood Glucose POCT: 75 mg/dL (ref 70–100)
Whole Blood Glucose POCT: 351 mg/dL — ABNORMAL HIGH (ref 70–100)
Whole Blood Glucose POCT: 239 mg/dL — ABNORMAL HIGH (ref 70–100)
Whole Blood Glucose POCT: 44 mg/dL — CL (ref 70–100)
Whole Blood Glucose POCT: 60 mg/dL — ABNORMAL LOW (ref 70–100)
Whole Blood Glucose POCT: 105 mg/dL — ABNORMAL HIGH (ref 70–100)
Whole Blood Glucose POCT: 148 mg/dL — ABNORMAL HIGH (ref 70–100)

## 2018-07-03 LAB — GFR
EGFR: >60
EGFR: >60

## 2018-07-03 LAB — MAGNESIUM: Magnesium: 1.6 mg/dL (ref 1.6–2.6)

## 2018-07-03 MED ORDER — POTASSIUM CHLORIDE CRYS ER 20 MEQ PO TBCR
40.00 meq | EXTENDED_RELEASE_TABLET | Freq: Once | ORAL | Status: AC
Start: 2018-07-03 — End: 2018-07-03
  Administered 2018-07-03: 15:00:00 40 meq via ORAL
  Filled 2018-07-03: qty 2

## 2018-07-03 MED ORDER — INSULIN GLARGINE 100 UNIT/ML SC SOLN
20.00 [IU] | Freq: Two times a day (BID) | SUBCUTANEOUS | 0 refills | Status: DC
Start: 2018-07-03 — End: 2018-07-27

## 2018-07-03 MED ORDER — OSELTAMIVIR PHOSPHATE 75 MG PO CAPS
75.00 mg | ORAL_CAPSULE | Freq: Two times a day (BID) | ORAL | 0 refills | Status: AC
Start: 2018-07-03 — End: 2018-07-05

## 2018-07-03 MED ORDER — FLUTICASONE FUROATE-VILANTEROL 200-25 MCG/INH IN AEPB
1.00 | INHALATION_SPRAY | Freq: Every day | RESPIRATORY_TRACT | 0 refills | Status: AC
Start: 2018-07-03 — End: ?

## 2018-07-03 MED ORDER — INSULIN GLARGINE 100 UNIT/ML SC SOLN
20.00 [IU] | Freq: Two times a day (BID) | SUBCUTANEOUS | Status: DC
Start: 2018-07-03 — End: 2018-07-03

## 2018-07-03 MED ORDER — ALBUTEROL SULFATE HFA 108 (90 BASE) MCG/ACT IN AERS
2.00 | INHALATION_SPRAY | Freq: Four times a day (QID) | RESPIRATORY_TRACT | 0 refills | Status: AC | PRN
Start: 2018-07-03 — End: ?

## 2018-07-03 MED ORDER — POTASSIUM CHLORIDE 10 MEQ/100ML IV SOLN
10.00 meq | Freq: Once | INTRAVENOUS | Status: AC
Start: 2018-07-03 — End: 2018-07-03
  Administered 2018-07-03: 15:00:00 10 meq via INTRAVENOUS
  Filled 2018-07-03: qty 100

## 2018-07-03 MED ORDER — INSULIN LISPRO 100 UNIT/ML SC SOLN
SUBCUTANEOUS | 3 refills | Status: DC
Start: 2018-07-03 — End: 2018-07-09

## 2018-07-03 NOTE — Final Progress Note (DC Note for stay less than 48 (Signed)
Date:07/03/2018   Patient Name: [name] [name], [name] [name].  Attending Physician: Theresia Lo, MD  Today:   BP 125/79   Pulse 97   Temp 100 F (37.8 C) (Oral)   Resp 16   Ht 1.727 m (5' 7.99")   Wt 104 kg (229 lb 4.5 oz)   SpO2 98%   BMI 34.87 kg/m   Ranges for the last 24 hours:  Temp:  [99.1 F (37.3 C)-100 F (37.8 C)] 100 F (37.8 C)  Heart Rate:  [92-111] 97  Resp Rate:  [16-19] 16  BP: (125-156)/(79-90) 125/79    Date of Admission:   07/01/2018    Date of Discharge:   07/03/2018     Outcome of Hospitalization:   Active Problems:    DKA (diabetic ketoacidoses)  Resolved Problems:    * No resolved hospital problems. *     Significant Events:     DKA-resolved anion gap closed  Type I diabetic mellitus    22 years old male with history of type 2 diabetes mellitus who had recently admitted for DKA presented to the hospital with 3 days history of malaise body ache poor appetite abdominal pain and low oral intake.  He was found to be in DKA and he was positive for influenza B.  He was admitted into ICU for DKA management    He takes 25 units of Lantus twice daily in addition to NovoLog sliding scale per carbohydrate count.  Was diagnosed with type 1 diabetes mellitus at age 69, had a history of asthma and he smokes cigarettes.  Started on Lantus 25 units twice a day after the diet started.  However he had hypoglycemia the next day.  Patient reports that he did not like his dinner last night.    Patient had another hypoglycemia today just before dinner, he reports that he just had a few bites of his scrambled eggs this morning and half of sausage breakfast and for lunch he only had half of tilapia.    His diet is different at home patient eats more carbs at home and he rarely have hypoglycemia on 25 units of Lantus.  Patient rather go home can stay in the hospital for another night.    Patient was encouraged to eat some a snack tonight before he goes to bed his Lantus switch to 20 units twice a day for next few  days.    Acute dehydration and acute kidney injury secondary to prerenal-resolved  Anion gap metabolic acidosis secondary to DKA-resolved  Pseudohyponatremia secondary to DKA-solved    Influenza B  Asthma with mild bronchospasm-resolved  he developed upper respiratory symptoms few days ago he also reports subjective fever.  He takes his albuterol as needed.  The hospital he was positive for influenza B and he was a started on Tamiflu 75 mg twice daily for 5 days    Disposition Home   Patient just recently moved to the area and he does not have a primary care doctor or endocrinology, for was prescribed Lantus and NovoLog with refills until he find PCP    Lab Results last 48 Hours     Procedure Component Value Units Date/Time    Glucose Whole Blood - POCT [[id number]]  (Abnormal) Collected:  07/03/18 1731     Updated:  07/03/18 1733     POCT - Glucose Whole blood 105 mg/dL     Glucose Whole Blood - POCT [[id number]]  (Abnormal) Collected:  07/03/18 1711     Updated:  07/03/18  1713     POCT - Glucose Whole blood 44 mg/dL     Glucose Whole Blood - POCT [[id number]]  (Abnormal) Collected:  07/03/18 1648     Updated:  07/03/18 1652     POCT - Glucose Whole blood 60 mg/dL     Glucose Whole Blood - POCT [[id number]]  (Abnormal) Collected:  07/03/18 1137     Updated:  07/03/18 1140     POCT - Glucose Whole blood 239 mg/dL     Glucose Whole Blood - POCT [[id number]]  (Abnormal) Collected:  07/03/18 0940     Updated:  07/03/18 0943     POCT - Glucose Whole blood 351 mg/dL     Glucose Whole Blood - POCT [[id number]]  (Abnormal) Collected:  07/03/18 0821     Updated:  07/03/18 0824     POCT - Glucose Whole blood 177 mg/dL     Glucose Whole Blood - POCT [[id number]]  (Abnormal) Collected:  07/03/18 0806     Updated:  07/03/18 0810     POCT - Glucose Whole blood 125 mg/dL     Glucose Whole Blood - POCT [[id number]] Collected:  07/03/18 0750     Updated:  07/03/18 0753     POCT - Glucose Whole blood 75 mg/dL     Glucose Whole Blood - POCT  [[id number]]  (Abnormal) Collected:  07/03/18 0734     Updated:  07/03/18 0741     POCT - Glucose Whole blood 42 mg/dL     GFR [[id number]] Collected:  07/03/18 0600     Updated:  07/03/18 0731     EGFR >60.0    Basic Metabolic Panel [[id number]]  (Abnormal) Collected:  07/03/18 0600    Specimen:  Blood Updated:  07/03/18 0731     Glucose 47 mg/dL      BUN 4 mg/dL      Creatinine 0.8 mg/dL      Calcium 8.0 mg/dL      Sodium 016 mEq/L      Potassium 3.2 mEq/L      Chloride 105 mEq/L      CO2 23 mEq/L      Anion Gap 8.0    Magnesium [[id number]] Collected:  07/03/18 0600    Specimen:  Blood Updated:  07/03/18 0731     Magnesium 1.6 mg/dL     Phosphorus [[id number]] Collected:  07/03/18 0600    Specimen:  Blood Updated:  07/03/18 0731     Phosphorus 3.5 mg/dL     Glucose Whole Blood - POCT [[id number]]  (Abnormal) Collected:  07/02/18 2006     Updated:  07/02/18 2009     POCT - Glucose Whole blood 247 mg/dL     MRSA culture - Throat (If not done in triage) [[id number]] Collected:  07/01/18 1721    Specimen:  Body Fluid from Throat Updated:  07/02/18 1744     Culture MRSA Surveillance Negative for Methicillin Resistant Staph aureus    MRSA culture - Nares (If not done in triage) [[id number]] Collected:  07/01/18 1721    Specimen:  Body Fluid from Nares Updated:  07/02/18 1744     Culture MRSA Surveillance Negative for Methicillin Resistant Staph aureus    Glucose Whole Blood - POCT [[id number]]  (Abnormal) Collected:  07/02/18 1557     Updated:  07/02/18 1600     POCT - Glucose Whole blood 215 mg/dL     Glucose Whole Blood - POCT [[id number]]  (Abnormal) Collected:  07/02/18 1148  Updated:  07/02/18 1150     POCT - Glucose Whole blood 326 mg/dL     Glucose Whole Blood - POCT [[id number]]  (Abnormal) Collected:  07/02/18 0807     Updated:  07/02/18 0811     POCT - Glucose Whole blood 168 mg/dL     Glucose Whole Blood - POCT [[id number]]  (Abnormal) Collected:  07/02/18 0702     Updated:  07/02/18 0704     POCT - Glucose Whole  blood 166 mg/dL     Phosphorus [[id number]] Collected:  07/02/18 0415    Specimen:  Blood Updated:  07/02/18 0532     Phosphorus 2.6 mg/dL     GFR [[id number]] Collected:  07/02/18 0415     Updated:  07/02/18 0532     EGFR >60.0    Basic Metabolic Panel [[id number]]  (Abnormal) Collected:  07/02/18 0415    Specimen:  Blood Updated:  07/02/18 0532     Glucose 182 mg/dL      BUN 12 mg/dL      Creatinine 1.1 mg/dL      Calcium 7.6 mg/dL      Sodium 244 mEq/L      Potassium 3.4 mEq/L      Chloride 109 mEq/L      CO2 19 mEq/L      Anion Gap 8.0    Magnesium [[id number]] Collected:  07/02/18 0415    Specimen:  Blood Updated:  07/02/18 0532     Magnesium 2.0 mg/dL     CBC without differential [[id number]] Collected:  07/02/18 0415    Specimen:  Blood Updated:  07/02/18 0511     WBC 6.32 x10 3/uL      Hgb 12.9 g/dL      Hematocrit 59.5 %      Platelets 184 x10 3/uL      RBC 4.98 x10 6/uL      MCV 79.3 fL      MCH 25.9 pg      MCHC 32.7 g/dL      RDW 13 %      MPV 11.6 fL      Nucleated RBC 0.0 /100 WBC      Absolute NRBC 0.00 x10 3/uL     Glucose Whole Blood - POCT [[id number]]  (Abnormal) Collected:  07/02/18 0458     Updated:  07/02/18 0501     POCT - Glucose Whole blood 178 mg/dL     Glucose Whole Blood - POCT [[id number]]  (Abnormal) Collected:  07/02/18 0410     Updated:  07/02/18 0419     POCT - Glucose Whole blood 202 mg/dL     Glucose Whole Blood - POCT [[id number]]  (Abnormal) Collected:  07/02/18 0314     Updated:  07/02/18 0317     POCT - Glucose Whole blood 173 mg/dL     Glucose Whole Blood - POCT [[id number]]  (Abnormal) Collected:  07/02/18 0204     Updated:  07/02/18 0209     POCT - Glucose Whole blood 160 mg/dL     Glucose Whole Blood - POCT [[id number]]  (Abnormal) Collected:  07/02/18 0106     Updated:  07/02/18 0110     POCT - Glucose Whole blood 158 mg/dL     GFR [[id number]] Collected:  07/02/18 0013     Updated:  07/02/18 0055     EGFR >60.0    Narrative:       To begin when Anion Gap is less than 12.  Basic  Metabolic Panel [[id number]]  (Abnormal) Collected:  07/02/18 0013    Specimen:  Blood Updated:  07/02/18 0055     Glucose 151 mg/dL      BUN 13 mg/dL      Creatinine 1.1 mg/dL      Calcium 7.7 mg/dL      Sodium 409 mEq/L      Potassium 3.6 mEq/L      Chloride 107 mEq/L      CO2 17 mEq/L      Anion Gap 10.0    Narrative:       To begin when Anion Gap is less than 12.    Glucose Whole Blood - POCT [[id number]]  (Abnormal) Collected:  07/01/18 2358     Updated:  07/02/18 0007     POCT - Glucose Whole blood 158 mg/dL     Glucose Whole Blood - POCT [[id number]]  (Abnormal) Collected:  07/01/18 2255     Updated:  07/01/18 2257     POCT - Glucose Whole blood 247 mg/dL     Glucose Whole Blood - POCT [[id number]]  (Abnormal) Collected:  07/01/18 2203     Updated:  07/01/18 2208     POCT - Glucose Whole blood 297 mg/dL     Hemoglobin B2W [[id number]]  (Abnormal) Collected:  07/01/18 1327    Specimen:  Blood Updated:  07/01/18 2141     Hemoglobin A1C 12.8 %      Average Estimated Glucose 320.7 mg/dL     Narrative:       This is NOT the correct Test for Patients with  Hemoglobinopathy.    Basic Metabolic Panel [[id number]]  (Abnormal) Collected:  07/01/18 2002    Specimen:  Blood Updated:  07/01/18 2108     Glucose 313 mg/dL      BUN 15 mg/dL      Creatinine 1.1 mg/dL      Calcium 7.7 mg/dL      Sodium 034 mEq/L      Potassium 5.0 mEq/L      Chloride 102 mEq/L      CO2 18 mEq/L      Anion Gap 11.0    Narrative:       To begin when Anion Gap is less than 12.    Magnesium [[id number]] Collected:  07/01/18 2002    Specimen:  Blood Updated:  07/01/18 2108     Magnesium 2.6 mg/dL     Narrative:       To begin when Anion Gap is less than 12.    Phosphorus [[id number]] Collected:  07/01/18 2002    Specimen:  Blood Updated:  07/01/18 2108     Phosphorus 2.9 mg/dL     Narrative:       To begin when Anion Gap is less than 12.    GFR [[id number]] Collected:  07/01/18 2002     Updated:  07/01/18 2108     EGFR >60.0    Narrative:       To begin  when Anion Gap is less than 12.    Glucose Whole Blood - POCT [[id number]]  (Abnormal) Collected:  07/01/18 2100     Updated:  07/01/18 2103     POCT - Glucose Whole blood 335 mg/dL     Glucose Whole Blood - POCT [[id number]]  (Abnormal) Collected:  07/01/18 1955     Updated:  07/01/18 1958     POCT - Glucose Whole blood 284 mg/dL     Glucose  Whole Blood - POCT [[id number]]  (Abnormal) Collected:  07/01/18 1902     Updated:  07/01/18 1908     POCT - Glucose Whole blood 265 mg/dL     Basic Metabolic Panel [[id number]]  (Abnormal) Collected:  07/01/18 1622    Specimen:  Blood Updated:  07/01/18 1848     Glucose 262 mg/dL      BUN 17 mg/dL      Creatinine 1.3 mg/dL      Calcium 7.6 mg/dL      Sodium 332 mEq/L      Potassium 3.6 mEq/L      Chloride 102 mEq/L      CO2 14 mEq/L      Anion Gap 18.0    Narrative:       To begin when Anion Gap is less than 12.    Magnesium [[id number]] Collected:  07/01/18 1622    Specimen:  Blood Updated:  07/01/18 1848     Magnesium 1.8 mg/dL     Narrative:       To begin when Anion Gap is less than 12.    Phosphorus [[id number]] Collected:  07/01/18 1622    Specimen:  Blood Updated:  07/01/18 1848     Phosphorus 3.8 mg/dL     Narrative:       To begin when Anion Gap is less than 12.    GFR [[id number]] Collected:  07/01/18 1622     Updated:  07/01/18 1848     EGFR >60.0    Narrative:       To begin when Anion Gap is less than 12.    Glucose Whole Blood - POCT [[id number]]  (Abnormal) Collected:  07/01/18 1808     Updated:  07/01/18 1813     POCT - Glucose Whole blood 242 mg/dL           Procedures performed:   Radiology: all results from this admission  Xr Chest 2 Views    Result Date: 07/02/2018   1. Minimal lingular airspace disease which may represent mild focal scarring or pneumonitis 2. Slight blunting of left costophrenic angle which may indicate a trace left pleural effusion or pleural thickening/scarring Laurena Slimmer, MD 07/02/2018 10:33 AM      Treatment Team:   Attending Provider:  Theresia Lo, MD  Consulting Physician: Theresia Lo, MD    Disposition:   Disposition: Home or Self Care    Condition at Discharge:   good     Follow up Recommendations for Receiving Provider     1.   Unresulted Labs     None          Discharge Instructions:     Follow-up Information     Schedule an appointment as soon as possible for a visit with Mackey Birchwood, MD.    Specialty:  Family Medicine  Contact information:  [address]  [address] [address]  [phone]             Pcp, None, MD .               Extended Emergency Contact Information  Primary Emergency Contact: Lenetta Quaker  Mobile Phone: [phone]  Relation: Mother  Secondary Emergency Contact: Lezlie Lye  Mobile Phone: [phone]  Relation: Significant Other     Discharge Medication List      Taking    ACCU-CHEK FASTCLIX LANCETS Misc  Check blood sugar 7 times daily, as directed by MD.  albuterol 108 (90 Base) MCG/ACT inhaler  Dose:  2 puff  Commonly known as:  PROVENTIL HFA;VENTOLIN HFA  Inhale 2 puffs into the lungs every 6 (six) hours as needed for Wheezing or Shortness of Breath     fluticasone furoate-vilanterol 200-25 MCG/INH Aepb  Dose:  1 puff  Commonly known as:  BREO ELLIPTA  Inhale 1 puff into the lungs daily     glucagon 1 MG injection  Dose:  1 mg  Commonly known as:  GLUCAGON EMERGENCY  Inject 1 mg into the skin once as needed (hypoglycemia less than 50)     glucose blood test strip  Commonly known as:  ACCU-CHEK AVIVA PLUS  Check blood sugar 7 times daily, as directed by MD.     insulin glargine 100 UNIT/ML injection  Dose:  20 Units  What changed:  how much to take  Commonly known as:  LANTUS  Inject 20 Units into the skin every 12 (twelve) hours     insulin lispro 100 UNIT/ML injection  Commonly known as:  HumaLOG  For:  Insulin-Dependent Diabetes  Administer via carb count protocal     oseltamivir 75 MG capsule  Dose:  75 mg  Commonly known as:  TAMIFLU  Take 1 capsule (75 mg total) by mouth every 12 (twelve)  hours for 2 days            Signed by: Theresia Lo, MD  CC Pcp, None, MD

## 2018-07-03 NOTE — Progress Notes (Signed)
Chaplain Service      Assessment:       Background:  Visit Type: Initial was made by Chaplain with patient, [name] [name]., based on Source: Chaplain Initiated.  Present at Visit: friends at bedside.  Spiritual Care Provided to: patient only.  Length of Visit: 0-15 minutes .    Summary:          Spiritual Care Outcomes: Family appeared to have appreciated visit

## 2018-07-03 NOTE — Progress Notes (Signed)
07/03/18 1758   Discharge Disposition   Patient preference/choice provided? N/A   Physical Discharge Disposition Home   Name of Home Health Agency Placement   (n/a)   Name of Hospice Agency   (n/a)   Name of DME Agency   (n/a)   Name of Infusion Company Placement   (n/a)   Receiving facility, unit and room number:   (n/a)   Nursing report phone number:   (n/a)   Facility fax number:   (n/a)   Mode of Transportation Car   Patient/Family/POA notified of transfer plan Yes   Patient agreeable to discharge plan/expected d/c date? Yes   Family/POA agreeable to discharge plan/expected d/c date? Yes   Bedside nurse notified of transport plan? Yes   Special requirements for patient during transport:   (n/a)   CM Interventions   Follow up appointment scheduled? No   Reason no follow up scheduled? Family to schedule;Referred to [facility]   Referral made for home health RN visit? No, Other (comment)   Multidisciplinary rounds/family meeting before d/c? Yes   Medicare Checklist   Is this a Medicare patient? No

## 2018-07-03 NOTE — Progress Notes (Signed)
Patient seen as per admission screening it was selected that patient was on tube feeds/TPN prior to admission. Patient is on oral diet and no history of tube feeds/TPN, this was selected in error. Patient is currently on appropriate diet.    Katheran Awe, RDN  07/03/2018 at 10:12 AM

## 2018-07-03 NOTE — UM Notes (Signed)
Concurrent clinical review 07/03/18    Renal: Mild AKI in setting of DKA/volume depletion/viral infection/poor appetite on admission (resolved).  - Monitor UOP, strict I/Os, trend BUN/Cr, daily weights  - Monitor and correct lytes prn  - Maintenance IVF: plasmalyte infusion x 12 additional hrs with re-evaluation for further IVF afterwards.     Endo: DKA (IDDM), multiple admissions.   - IVF hydration  - Switched to [facility] (home regimen) this AM 12/29 after AG closed overnight.   - Monitor electrolyte and replete as needed.  - HgA1c: 12.8 (suspect non-compliance in addition to current admission with DKA triggered by influenza).   - Will need to establish outpatient PCP/endocrine follow up (aware of it based on discussion with patient on 12/28 and prior admission in Nov 2019).

## 2018-07-03 NOTE — Plan of Care (Signed)
Problem: Diabetes: Glucose Imbalance  Goal: Blood glucose stable at established goal  Outcome: Progressing  Flowsheets (Taken 07/03/2018 0048)  Blood glucose stable at established goal : Monitor lab values; Monitor intake and output.  Notify LIP if urine output is < 30 mL/hour.; Follow fluid restrictions/IV/PO parameters; Include patient/family in decisions related to nutrition/dietary selections; Assess for hypoglycemia /hyperglycemia; Monitor/assess vital signs; Coordinate medication administration with meals, as indicated; Ensure appropriate diet and assess tolerance; Ensure adequate hydration; Ensure appropriate consults are obtained (Nutrition, Diabetes Education, and Case Management); Ensure patient/family has adequate teaching materials     Problem: Compromised Hemodynamic Status  Goal: Vital signs and fluid balance maintained/improved  Outcome: Progressing  Flowsheets (Taken 07/03/2018 0048)  Vital signs and fluid balance are maintained/improved: Position patient for maximum circulation/cardiac output; Monitor/assess vitals and hemodynamic parameters with position changes; Monitor intake and output. Notify LIP if urine output is less than 30 mL/hour.; Monitor/assess lab values and report abnormal values     Problem: Inadequate Gas Exchange  Goal: Adequate oxygenation and improved ventilation  Outcome: Progressing  Flowsheets (Taken 07/03/2018 0048)  Adequate oxygenation and improved ventilation: Assess lung sounds; Monitor SpO2 and treat as needed; Provide mechanical and oxygen support to facilitate gas exchange; Position for maximum ventilatory efficiency; Teach/reinforce use of incentive spirometer 10 times per hour while awake, cough and deep breath as needed; Plan activities to conserve energy: plan rest periods; Increase activity as tolerated/progressive mobility; Consult/collaborate with Respiratory Therapy     Problem: Inadequate Tissue Perfusion-Venous  Goal: Tissue perfusion is  adequate-venous  Outcome: Progressing  Flowsheets (Taken 07/03/2018 0048)  Tissue perfusion is adequate-venous : Increase activity as tolerated / progressive mobility; Elevate feet when in chair; Teach/review/reinforce ankle pump exercises     Problem: Nutrition  Goal: Nutritional intake is adequate  Outcome: Progressing  Flowsheets (Taken 07/03/2018 0048)  Nutritional intake is adequate: Assist patient with meals/food selection; Allow adequate time for meals; Encourage/perform oral hygiene as appropriate; Encourage/administer dietary supplements as ordered (i.e. tube feed, TPN, oral, OGT/NGT, supplements); Consult/collaborate with Clinical Nutritionist; Include patient/patient care companion in decisions related to nutrition; Assess anorexia, appetite, and amount of meal/food tolerated; Consult/collaborate with Speech Therapy (swallow evaluations)    Problem: Safety  Goal: Patient will be free from injury during hospitalization  Outcome: Progressing  Flowsheets (Taken 07/03/2018 0047)  Patient will be free from injury during hospitalization : Assess patient's risk for falls and implement fall prevention plan of care per policy; Include patient/ family/ care giver in decisions related to safety; Provide and maintain safe environment; Use appropriate transfer methods; Hourly rounding; Ensure appropriate safety devices are available at the bedside; Assess for patients risk for elopement and implement Elopement Risk Plan per policy; Provide alternative method of communication if needed (communication boards, writing)     Problem: Pain  Goal: Pain at adequate level as identified by patient  Outcome: Progressing  Flowsheets (Taken 07/03/2018 0047)  Pain at adequate level as identified by patient: Identify patient comfort function goal; Reassess pain within 30-60 minutes of any procedure/intervention, per Pain Assessment, Intervention, Reassessment (AIR) Cycle; Evaluate if patient comfort function goal is met; Offer  non-pharmacological pain management interventions; Assess for risk of opioid induced respiratory depression, including snoring/sleep apnea. Alert healthcare team of risk factors identified.; Assess pain on admission, during daily assessment and/or before any "as needed" intervention(s)     Problem: Fluid and Electrolyte Imbalance/ Endocrine  Goal: Fluid and electrolyte balance are achieved/maintained  Outcome: Progressing  Flowsheets (Taken 07/03/2018 0047)  Fluid  and electrolyte balance are achieved/maintained: Monitor intake and output every shift; Monitor/assess lab values and report abnormal values; Provide adequate hydration; Assess and reassess fluid and electrolyte status; Assess for confusion/personality changes    Note:  Pt on sliding scale insulin coverage. Pt is A&O x 4, bed at the lowest position.  Bed alarm on.  Hourly rounding in place. Call bell within reach.

## 2018-07-03 NOTE — Discharge Instr - AVS First Page (Addendum)
Reason for your Hospital Admission:  DKA - uncontrol DM           Instructions for after your discharge:    Take only 20 U lantus twice a day for few days , you had low blood sugar in hospital on 25     Find PCP and endocrinology asap

## 2018-07-03 NOTE — Plan of Care (Signed)
Problem: Safety  Goal: Patient will be free from injury during hospitalization  Outcome: Progressing  Flowsheets (Taken 07/02/2018 0233 by Louanna Raw, RN)  Patient will be free from injury during hospitalization : Assess patient's risk for falls and implement fall prevention plan of care per policy;Include patient/ family/ care giver in decisions related to safety;Provide and maintain safe environment;Use appropriate transfer methods;Hourly rounding;Ensure appropriate safety devices are available at the bedside;Assess for patients risk for elopement and implement Elopement Risk Plan per policy;Provide alternative method of communication if needed (communication boards, writing)  Goal: Patient will be free from infection during hospitalization  Outcome: Progressing  Flowsheets (Taken 07/01/2018 1831 by Caryl Bis, RN)  Free from Infection during hospitalization: Assess and monitor for signs and symptoms of infection;Monitor lab/diagnostic results;Monitor all insertion sites (i.e. indwelling lines, tubes, urinary catheters, and drains);Encourage patient and family to use good hand hygiene technique     Problem: Fluid and Electrolyte Imbalance/ Endocrine  Goal: Fluid and electrolyte balance are achieved/maintained  Outcome: Progressing  Flowsheets (Taken 07/02/2018 0233 by Louanna Raw, RN)  Fluid and electrolyte balance are achieved/maintained: Monitor intake and output every shift;Monitor/assess lab values and report abnormal values;Provide adequate hydration;Assess and reassess fluid and electrolyte status;Assess for confusion/personality changes     Problem: Diabetes: Glucose Imbalance  Goal: Blood glucose stable at established goal  Outcome: Progressing  Flowsheets (Taken 07/03/2018 0048 by Loleta Rose, RN)  Blood glucose stable at established goal : Monitor lab values;Monitor intake and output.  Notify LIP if urine output is < 30 mL/hour.;Follow fluid restrictions/IV/PO parameters;Include  patient/family in decisions related to nutrition/dietary selections;Assess for hypoglycemia /hyperglycemia;Monitor/assess vital signs;Coordinate medication administration with meals, as indicated;Ensure appropriate diet and assess tolerance;Ensure adequate hydration;Ensure appropriate consults are obtained (Nutrition, Diabetes Education, and Case Management);Ensure patient/family has adequate teaching materials     Problem: Compromised Hemodynamic Status  Goal: Vital signs and fluid balance maintained/improved  Outcome: Progressing  Flowsheets (Taken 07/03/2018 0048 by Loleta Rose, RN)  Vital signs and fluid balance are maintained/improved: Position patient for maximum circulation/cardiac output;Monitor/assess vitals and hemodynamic parameters with position changes;Monitor intake and output. Notify LIP if urine output is less than 30 mL/hour.;Monitor/assess lab values and report abnormal values     Problem: Inadequate Gas Exchange  Goal: Adequate oxygenation and improved ventilation  Outcome: Progressing  Flowsheets (Taken 07/03/2018 0048 by Loleta Rose, RN)  Adequate oxygenation and improved ventilation: Assess lung sounds;Monitor SpO2 and treat as needed;Provide mechanical and oxygen support to facilitate gas exchange;Position for maximum ventilatory efficiency;Teach/reinforce use of incentive spirometer 10 times per hour while awake, cough and deep breath as needed;Plan activities to conserve energy: plan rest periods;Increase activity as tolerated/progressive mobility;Consult/collaborate with Respiratory Therapy     Problem: Inadequate Tissue Perfusion-Venous  Goal: Tissue perfusion is adequate-venous  Outcome: Progressing  Flowsheets (Taken 07/03/2018 0048 by Loleta Rose, RN)  Tissue perfusion is adequate-venous : Increase activity as tolerated / progressive mobility;Elevate feet when in chair;Teach/review/reinforce ankle pump exercises     Problem: Nutrition  Goal: Nutritional intake is  adequate  Outcome: Progressing  Flowsheets (Taken 07/03/2018 0048 by Loleta Rose, RN)  Nutritional intake is adequate: Assist patient with meals/food selection;Allow adequate time for meals;Encourage/perform oral hygiene as appropriate;Encourage/administer dietary supplements as ordered (i.e. tube feed, TPN, oral, OGT/NGT, supplements);Consult/collaborate with Clinical Nutritionist;Include patient/patient care companion in decisions related to nutrition;Assess anorexia, appetite, and amount of meal/food tolerated;Consult/collaborate with Speech Therapy (swallow evaluations)  Note:   Patient's oral intake was minimal for past two days. But is better today  with most of lunch eaten.

## 2018-07-06 LAB — ECG 12-LEAD
Atrial Rate: 94 {beats}/min
P Axis: 81 degrees
P-R Interval: 152 ms
Q-T Interval: 342 ms
QRS Duration: 88 ms
QTC Calculation (Bezet): 427 ms
R Axis: 82 degrees
T Axis: 71 degrees
Ventricular Rate: 94 {beats}/min

## 2018-07-07 ENCOUNTER — Inpatient Hospital Stay: Payer: Medicaid HMO

## 2018-07-07 ENCOUNTER — Observation Stay
Admission: EM | Admit: 2018-07-07 | Discharge: 2018-07-09 | DRG: 420 | Disposition: A | Discharge status: Home / Self Care | Payer: Medicaid HMO | Attending: Internal Medicine | Admitting: Internal Medicine

## 2018-07-07 DIAGNOSIS — N179 Acute kidney failure, unspecified: Secondary | ICD-10-CM | POA: Insufficient documentation

## 2018-07-07 DIAGNOSIS — Z794 Long term (current) use of insulin: Secondary | ICD-10-CM | POA: Insufficient documentation

## 2018-07-07 DIAGNOSIS — E1010 Type 1 diabetes mellitus with ketoacidosis without coma: Principal | ICD-10-CM

## 2018-07-07 DIAGNOSIS — F17210 Nicotine dependence, cigarettes, uncomplicated: Secondary | ICD-10-CM | POA: Insufficient documentation

## 2018-07-07 LAB — URINALYSIS, REFLEX TO MICROSCOPIC EXAM IF INDICATED
Bilirubin, UA: NEGATIVE
Blood, UA: NEGATIVE
Glucose, UA: >=500 — AB
Ketones UA: 80 — AB
Leukocyte Esterase, UA: NEGATIVE
Nitrite, UA: NEGATIVE
Protein, UR: 30 — AB
Specific Gravity UA: 1.027 (ref 1.001–1.035)
Urine pH: 6 (ref 5.0–8.0)
Urobilinogen, UA: NEGATIVE mg/dL (ref 0.2–2.0)

## 2018-07-07 LAB — HEPATIC FUNCTION PANEL
ALT: 36 U/L (ref 0–55)
AST (SGOT): 43 U/L — ABNORMAL HIGH (ref 5–34)
Albumin/Globulin Ratio: 1.3 (ref 0.9–2.2)
Albumin: 4.8 g/dL (ref 3.5–5.0)
Alkaline Phosphatase: 160 U/L — ABNORMAL HIGH (ref 38–106)
Bilirubin Direct: 0.4 mg/dL (ref 0.0–0.5)
Bilirubin Indirect: 0.5 mg/dL (ref 0.2–1.0)
Bilirubin, Total: 0.9 mg/dL (ref 0.2–1.2)
Globulin: 3.7 g/dL — ABNORMAL HIGH (ref 2.0–3.6)
Protein, Total: 8.5 g/dL — ABNORMAL HIGH (ref 6.0–8.3)

## 2018-07-07 LAB — BASIC METABOLIC PANEL
Anion Gap: 28 — ABNORMAL HIGH (ref 5.0–15.0)
BUN: 16 mg/dL (ref 9–28)
CO2: 14 mEq/L — ABNORMAL LOW (ref 22–29)
Calcium: 10.9 mg/dL — ABNORMAL HIGH (ref 8.5–10.5)
Chloride: 93 mEq/L — ABNORMAL LOW (ref 100–111)
Creatinine: 1.6 mg/dL — ABNORMAL HIGH (ref 0.7–1.3)
Glucose: 408 mg/dL — ABNORMAL HIGH (ref 70–100)
Potassium: 5.4 mEq/L — ABNORMAL HIGH (ref 3.5–5.1)
Sodium: 135 mEq/L — ABNORMAL LOW (ref 136–145)

## 2018-07-07 LAB — GLUCOSE WHOLE BLOOD - POCT
Whole Blood Glucose POCT: 155 mg/dL — ABNORMAL HIGH (ref 70–100)
Whole Blood Glucose POCT: 215 mg/dL — ABNORMAL HIGH (ref 70–100)
Whole Blood Glucose POCT: 245 mg/dL — ABNORMAL HIGH (ref 70–100)
Whole Blood Glucose POCT: 302 mg/dL — ABNORMAL HIGH (ref 70–100)
Whole Blood Glucose POCT: 352 mg/dL — ABNORMAL HIGH (ref 70–100)
Whole Blood Glucose POCT: 53 mg/dL — CL (ref 70–100)
Whole Blood Glucose POCT: 60 mg/dL — ABNORMAL LOW (ref 70–100)

## 2018-07-07 LAB — CBC AND DIFFERENTIAL
Absolute NRBC: 0 10*3/uL (ref 0.00–0.00)
Basophils Absolute Automated: 0.07 10*3/uL (ref 0.00–0.08)
Basophils Automated: 0.6 %
Eosinophils Absolute Automated: 0 10*3/uL (ref 0.00–0.44)
Eosinophils Automated: 0 %
Hematocrit: 51 % — ABNORMAL HIGH (ref 37.6–49.6)
Hgb: 16.2 g/dL (ref 12.5–17.1)
Immature Granulocytes Absolute: 0.05 10*3/uL (ref 0.00–0.07)
Immature Granulocytes: 0.4 %
Lymphocytes Absolute Automated: 1.5 10*3/uL (ref 0.42–3.22)
Lymphocytes Automated: 11.9 %
MCH: 25.4 pg (ref 25.1–33.5)
MCHC: 31.8 g/dL (ref 31.5–35.8)
MCV: 79.9 fL (ref 78.0–96.0)
MPV: 11.1 fL (ref 8.9–12.5)
Monocytes Absolute Automated: 0.48 10*3/uL (ref 0.21–0.85)
Monocytes: 3.8 %
Neutrophils Absolute: 10.51 10*3/uL — ABNORMAL HIGH (ref 1.10–6.33)
Neutrophils: 83.3 %
Nucleated RBC: 0 /100 WBC (ref 0.0–0.0)
Platelets: 360 10*3/uL — ABNORMAL HIGH (ref 142–346)
RBC: 6.38 10*6/uL — ABNORMAL HIGH (ref 4.20–5.90)
RDW: 12 % (ref 11–15)
WBC: 12.61 10*3/uL — ABNORMAL HIGH (ref 3.10–9.50)

## 2018-07-07 LAB — LIPASE: Lipase: <4 U/L — ABNORMAL LOW (ref 8–78)

## 2018-07-07 LAB — APTT: PTT: 32 s (ref 23–37)

## 2018-07-07 LAB — GFR: EGFR: >60

## 2018-07-07 MED ORDER — ENOXAPARIN SODIUM 40 MG/0.4ML SC SOLN
40.00 mg | Freq: Every day | SUBCUTANEOUS | Status: DC
Start: 2018-07-08 — End: 2018-07-09
  Administered 2018-07-08: 10:00:00 40 mg via SUBCUTANEOUS
  Filled 2018-07-07 (×2): qty 0.4

## 2018-07-07 MED ORDER — LEVOFLOXACIN 250 MG PO TABS
250.0000 mg | ORAL_TABLET | Freq: Every day | ORAL | Status: DC
Start: 2018-07-07 — End: 2018-07-08
  Administered 2018-07-08: 250 mg via ORAL
  Filled 2018-07-07: qty 1

## 2018-07-07 MED ORDER — SODIUM CHLORIDE 0.9 % IV SOLN
0.10 [IU]/kg/h | INTRAVENOUS | Status: DC
Start: 2018-07-07 — End: 2018-07-07

## 2018-07-07 MED ORDER — SODIUM CHLORIDE 0.9 % IV SOLN
INTRAVENOUS | Status: DC
Start: 2018-07-07 — End: 2018-07-07

## 2018-07-07 MED ORDER — SODIUM CHLORIDE 0.9 % IV BOLUS
1000.00 mL | Freq: Once | INTRAVENOUS | Status: AC
Start: 2018-07-07 — End: 2018-07-07
  Administered 2018-07-07: 20:00:00 1000 mL via INTRAVENOUS

## 2018-07-07 MED ORDER — SODIUM CHLORIDE 0.9 % IV SOLN
0.10 [IU]/kg/h | INTRAVENOUS | Status: DC
Start: 2018-07-07 — End: 2018-07-07
  Administered 2018-07-07: 21:00:00 0.1 [IU]/kg/h via INTRAVENOUS
  Filled 2018-07-07 (×2): qty 1

## 2018-07-07 MED ORDER — DEXTROSE 10 % IV BOLUS
125.00 mL | INTRAVENOUS | Status: DC | PRN
Start: 2018-07-07 — End: 2018-07-08

## 2018-07-07 MED ORDER — INSULIN REGULAR HUMAN 100 UNIT/ML IJ SOLN
5.00 [IU] | Freq: Once | INTRAMUSCULAR | Status: AC
Start: 2018-07-07 — End: 2018-07-07
  Administered 2018-07-07: 21:00:00 5 [IU] via INTRAVENOUS
  Filled 2018-07-07: qty 15

## 2018-07-07 MED ORDER — INSULIN REGULAR HUMAN 100 UNIT/ML IJ SOLN
0.0750 [IU]/kg | Freq: Once | INTRAMUSCULAR | Status: DC
Start: 2018-07-07 — End: 2018-07-07

## 2018-07-07 MED ORDER — ALBUTEROL SULFATE HFA 108 (90 BASE) MCG/ACT IN AERS
2.00 | INHALATION_SPRAY | Freq: Four times a day (QID) | RESPIRATORY_TRACT | Status: DC | PRN
Start: 2018-07-07 — End: 2018-07-09

## 2018-07-07 MED ORDER — FLUTICASONE FUROATE-VILANTEROL 200-25 MCG/INH IN AEPB
1.00 | INHALATION_SPRAY | Freq: Every morning | RESPIRATORY_TRACT | Status: DC
Start: 2018-07-08 — End: 2018-07-09
  Administered 2018-07-08 – 2018-07-09 (×2): 1 via RESPIRATORY_TRACT
  Filled 2018-07-07: qty 14

## 2018-07-07 MED ORDER — KCL IN DEXTROSE-NACL 20-5-0.45 MEQ/L-%-% IV SOLN
INTRAVENOUS | Status: DC
Start: 2018-07-07 — End: 2018-07-08

## 2018-07-07 MED ORDER — SODIUM CHLORIDE 0.9 % IV BOLUS
1000.00 mL | Freq: Once | INTRAVENOUS | Status: DC
Start: 2018-07-07 — End: 2018-07-07

## 2018-07-07 MED ORDER — SODIUM CHLORIDE 0.9 % IV SOLN
0.10 [IU]/kg/h | INTRAVENOUS | Status: DC
Start: 2018-07-07 — End: 2018-07-08

## 2018-07-07 MED ORDER — SODIUM CHLORIDE 0.9 % IV BOLUS
1000.00 mL | Freq: Once | INTRAVENOUS | Status: AC
Start: 2018-07-07 — End: 2018-07-07
  Administered 2018-07-07: 19:00:00 1000 mL via INTRAVENOUS

## 2018-07-07 MED ORDER — DEXTROSE 10 % IV BOLUS
125.00 mL | INTRAVENOUS | Status: DC | PRN
Start: 2018-07-07 — End: 2018-07-07

## 2018-07-07 MED ORDER — OSELTAMIVIR PHOSPHATE 75 MG PO CAPS
75.00 mg | ORAL_CAPSULE | Freq: Two times a day (BID) | ORAL | Status: DC
Start: 2018-07-07 — End: 2018-07-08
  Administered 2018-07-07: 75 mg via ORAL
  Filled 2018-07-07: qty 1

## 2018-07-07 MED ORDER — DEXTROSE 10 % IV BOLUS
125.00 mL | INTRAVENOUS | Status: DC | PRN
Start: 2018-07-07 — End: 2018-07-08
  Administered 2018-07-07 (×2): 125 mL via INTRAVENOUS
  Filled 2018-07-07: qty 250

## 2018-07-07 NOTE — ED Provider Notes (Signed)
EMERGENCY DEPARTMENT NOTE    Physician/Midlevel provider first contact with patient: 07/07/18 1846         HISTORY OF PRESENT ILLNESS   Historian: Patient  Translator Used: No    Chief Complaint: Altered Mental Status (BIBA re: altered mental status/diabetic. EMS responds to pt altered and flailing about non-coherent. IV placed and NS w/o for pt 100.1 tympanic and accu 368.  Pt now much more coherent per EMS. IDDM +. Pt now a good historian relating N/v and dizzy this day. Extreme thirst..) and Cough     Mechanism of Injury:       23 y.o. male who significant history of type 1 diabetes who presents emergency department the chief complaint of cough, nausea, vomiting, elevated glucose, altered mental status.  Patient reports that he was unable to fill his glucose from a couple of days ago and therefore has not had short acting insulin all day today.  Long-acting insulin was last taken this morning.  He reports he woke up this morning had some stomach pain along with nausea and vomiting.  He has been unable to keep any liquids or foods down today.  Slight cough.  No chest pain.  No shortness of breath.  No diarrhea.  He has no other complaints at this time.  Patient has had a history of DKA in the past.  He states that this feels similar.    1. Location of symptoms: Abdomen  2. Onset of symptoms: Today  3. What was patient doing when symptoms started (Context): see above  4. Severity: Moderate  5. Timing: Ongoing  6. Activities that worsen symptoms: Eating or drinking  7. Activities that improve symptoms: Nothing so far  8. Quality: "Pain" nausea  9. Radiation of symptoms: No  10. Associated signs and Symptoms: see above  11. Are symptoms worsening?  Yes  MEDICAL HISTORY     Past Medical History:  Past Medical History:   Diagnosis Date   . Diabetes mellitus    . Seasonal asthma        Past Surgical History:  History reviewed. No pertinent surgical history.    Social History:  Social History     Socioeconomic History    . Marital status: Single     Spouse name: Not on file   . Number of children: Not on file   . Years of education: Not on file   . Highest education level: Not on file   Occupational History   . Not on file   Social Needs   . Financial resource strain: Not on file   . Food insecurity:     Worry: Not on file     Inability: Not on file   . Transportation needs:     Medical: Not on file     Non-medical: Not on file   Tobacco Use   . Smoking status: Current Some Day Smoker     Types: Cigarettes   . Smokeless tobacco: Never Used   Substance and Sexual Activity   . Alcohol use: Yes     Comment: socially    . Drug use: Yes     Comment: marijuana a couple times a week    . Sexual activity: Not on file   Lifestyle   . Physical activity:     Days per week: Not on file     Minutes per session: Not on file   . Stress: Not on file   Relationships   . Social connections:  Talks on phone: Not on file     Gets together: Not on file     Attends religious service: Not on file     Active member of club or organization: Not on file     Attends meetings of clubs or organizations: Not on file     Relationship status: Not on file   . Intimate partner violence:     Fear of current or ex partner: Not on file     Emotionally abused: Not on file     Physically abused: Not on file     Forced sexual activity: Not on file   Other Topics Concern   . Not on file   Social History Narrative   . Not on file       Family History:  History reviewed. No pertinent family history.    Outpatient Medication:  Previous Medications    ACCU-CHEK FASTCLIX LANCETS MISC    Check blood sugar 7 times daily, as directed by MD.    ALBUTEROL (PROVENTIL HFA;VENTOLIN HFA) 108 (90 BASE) MCG/ACT INHALER    Inhale 2 puffs into the lungs every 6 (six) hours as needed for Wheezing or Shortness of Breath    FLUTICASONE FUROATE-VILANTEROL (BREO ELLIPTA) 200-25 MCG/INH AEROSOL POWDER, BREATH ACTIVTIVATEDE    Inhale 1 puff into the lungs daily    GLUCAGON (GLUCAGON  EMERGENCY) 1 MG INJECTION    Inject 1 mg into the skin once as needed (hypoglycemia less than 50)    GLUCOSE BLOOD (ACCU-CHEK AVIVA PLUS) TEST STRIP    Check blood sugar 7 times daily, as directed by MD.    INSULIN GLARGINE (LANTUS) 100 UNIT/ML INJECTION    Inject 20 Units into the skin every 12 (twelve) hours    INSULIN LISPRO (HUMALOG) 100 UNIT/ML INJECTION    Administer via carb count protocal         REVIEW OF SYSTEMS   Review of Systems   Constitutional: Negative for fever.   HENT: Negative for sore throat.    Eyes: Negative for pain.   Respiratory: Positive for cough. Negative for shortness of breath.    Cardiovascular: Negative for chest pain.   Gastrointestinal: Positive for abdominal pain, nausea and vomiting. Negative for diarrhea.   Genitourinary: Negative for dysuria.   Musculoskeletal: Negative for myalgias.   Skin: Negative for rash.   Neurological: Negative for sensory change, speech change, focal weakness, seizures, loss of consciousness and weakness.   Psychiatric/Behavioral: Negative for memory loss.   All other systems reviewed and are negative.             PHYSICAL EXAM     ED Triage Vitals   Enc Vitals Group      BP       Pulse       Resp       Temp       Temp src       SpO2       Weight       Height       Head Circumference       Peak Flow       Pain Score       Pain Loc       Pain Edu?       Excl. in GC?      Nursing note and vitals reviewed.  Constitutional:   alert and awake  Head:  Atraumatic. Normocephalic.    Eyes:  PERRL. EOMI. No scleral icterus  ENT:  Mucous membranes are dry.  Oropharynx is clear.  External ears normal. Patent airway.  Neck:  Supple. Full ROM.    Cardiovascular: Tachycardic. Regular rhythm. No murmurs, rubs, or gallops.  Pulmonary/Chest:  No evidence of respiratory distress. Clear to auscultation bilaterally.  No wheezing, rales or rhonchi.   Abdominal:  Soft and non-distended. No tenderness. No rebound, guarding, or rigidity.  Back:  Full ROM. Nontender.    Extremities:  No edema. No cyanosis. Full range of motion in all extremities.  Skin:  Skin is warm and dry.  No diaphoresis. No rash.   Neurological:  Alert, awake, and appropriate. Normal speech. Motor grossly normal. Cranial Nerves grossly intact by observation.   Psychiatric:  Good eye contact. Normal interaction, affect, and behavior.          MEDICAL DECISION MAKING     DISCUSSION    Patient presents with elevated glucose.  Patient was given IV fluids and awaiting BMP.  BMP shows evidence of DKA.  Anion gap of 28 with a CO2 of 14.  Given bolus of fluid x2 and then a rate of normal saline.  Patient also given bolus of IV insulin per request of intensivist and then placed on insulin drip.  Patient is feeling much improved.  Heart rate is improving.  Nausea and vomiting are improved and required no medications from myself.  Patient is nontoxic.  Stable for admission.  I spent time seeing the patient, managing fluid status, discussing with intensivist.  Patient disposition to the ICU.      Vital Signs: Reviewed the patient?s vital signs.   Nursing Notes: Reviewed and utilized available nursing notes.  Medical Records Reviewed: Reviewed available past medical records.  Counseling: The emergency provider has spoken with the patient and discussed today?s findings, in addition to providing specific details for the plan of care.  Questions are answered and there is agreement with the plan.          RADIOLOGY IMAGING STUDIES      XR Chest AP Portable    (Results Pending)           PULSE OXIMETRY    Oxygen Saturation by Pulse Oximetry: 99%  Interventions: none  Interpretation: Normal    EMERGENCY DEPT. MEDICATIONS      ED Medication Orders (From admission, onward)    Start Ordered     Status Ordering Provider    07/08/18 0900 07/07/18 2102  enoxaparin (LOVENOX) syringe 40 mg  Daily     Route: Subcutaneous  Ordered Dose: 40 mg     Ordered JOHN, REVERLY M    07/08/18 0900 07/07/18 2123  fluticasone furoate-vilanterol  (BREO ELLIPTA) 200-25 MCG/INH 1 puff  RT - Every morning     Route: Inhalation  Ordered Dose: 1 puff     Ordered JOHN, REVERLY M    07/07/18 2121 07/07/18 2123  albuterol (PROVENTIL HFA;VENTOLIN HFA) inhaler 2 puff  RT - Every 6 hours as needed     Route: Inhalation  Ordered Dose: 2 puff     Ordered JOHN, REVERLY M    07/07/18 2056 07/07/18 2055    Continuous     Route: Intravenous     Discontinued JOHN, REVERLY M    07/07/18 2056 07/07/18 2055  insulin regular (HumuLIN R,NovoLIN R) injection 4.81 Units  Once     Route: Intravenous  Ordered Dose: 0.075 Units/kg     Last MAR action:  Handoff/Dual RN [employer], REVERLY M    07/07/18 2056  07/07/18 2055    Continuous     Route: Intravenous  Ordered Dose: 0.1 Units/kg/hr     Discontinued JOHN, REVERLY M    07/07/18 2046 07/07/18 2045  insulin regular (HumuLIN R,NovoLIN R) injection 5 Units  Once     Route: Intravenous  Ordered Dose: 5 Units     Last MAR action:  Given Lisaann Atha, Trixie Rude    07/07/18 2046 07/07/18 2045  0.9%  NaCl infusion  Continuous     Route: Intravenous     Last MAR action:  [place] Lalah Durango M    07/07/18 2045 07/07/18 2055  dextrose (D10W) 10% bolus 125 mL  As needed     Route: Intravenous  Ordered Dose: 125 mL     Ordered JOHN, REVERLY M    07/07/18 2045 07/07/18 2055    As needed     Route: Intravenous  Ordered Dose: 125 mL     Discontinued JOHN, REVERLY M    07/07/18 2025 07/07/18 2024  insulin regular (HumuLIN R,NovoLIN R) 100 Units in sodium chloride 0.9 % 100 mL infusion  Continuous     Route: Intravenous  Ordered Dose: 0.1 Units/kg/hr     Last MAR action:  Stopped Arther Abbott    07/07/18 2005 07/07/18 2004  sodium chloride 0.9 % bolus 1,000 mL  Once     Route: Intravenous  Ordered Dose: 1,000 mL     Last MAR action:  Stopped Arther Abbott    07/07/18 1908 07/07/18 1907    Once     Route: Intravenous  Ordered Dose: 1,000 mL     Discontinued Kamaiyah Uselton M    07/07/18 1901 07/07/18 1901  sodium chloride 0.9 % bolus 1,000 mL  Once      Route: Intravenous  Ordered Dose: 1,000 mL     Last MAR action:  Stopped Stevens Magwood M          LABORATORY RESULTS    Ordered and independently interpreted AVAILABLE laboratory tests. Please see results section in chart for full details.  Results for orders placed or performed during the hospital encounter of 07/07/18   CBC and differential   Result Value Ref Range    WBC 12.61 (H) 3.10 - 9.50 x10 3/uL    Hgb 16.2 12.5 - 17.1 g/dL    Hematocrit 56.4 (H) 37.6 - 49.6 %    Platelets 360 (H) 142 - 346 x10 3/uL    RBC 6.38 (H) 4.20 - 5.90 x10 6/uL    MCV 79.9 78.0 - 96.0 fL    MCH 25.4 25.1 - 33.5 pg    MCHC 31.8 31.5 - 35.8 g/dL    RDW 12 11 - 15 %    MPV 11.1 8.9 - 12.5 fL    Neutrophils 83.3 None %    Lymphocytes Automated 11.9 None %    Monocytes 3.8 None %    Eosinophils Automated 0.0 None %    Basophils Automated 0.6 None %    Immature Granulocyte 0.4 None %    Nucleated RBC 0.0 0.0 - 0.0 /100 WBC    Neutrophils Absolute 10.51 (H) 1.10 - 6.33 x10 3/uL    Abs Lymph Automated 1.50 0.42 - 3.22 x10 3/uL    Abs Mono Automated 0.48 0.21 - 0.85 x10 3/uL    Abs Eos Automated 0.00 0.00 - 0.44 x10 3/uL    Absolute Baso Automated 0.07 0.00 - 0.08 x10 3/uL    Absolute Immature Granulocyte 0.05  0.00 - 0.07 x10 3/uL    Absolute NRBC 0.00 0.00 - 0.00 x10 3/uL   Hepatic function panel (LFT)   Result Value Ref Range    Bilirubin, Total 0.9 0.2 - 1.2 mg/dL    Bilirubin, Direct 0.4 0.0 - 0.5 mg/dL    Bilirubin, Indirect 0.5 0.2 - 1.0 mg/dL    AST (SGOT) 43 (H) 5 - 34 U/L    ALT 36 0 - 55 U/L    Alkaline Phosphatase 160 (H) 38 - 106 U/L    Protein, Total 8.5 (H) 6.0 - 8.3 g/dL    Albumin 4.8 3.5 - 5.0 g/dL    Globulin 3.7 (H) 2.0 - 3.6 g/dL    Albumin/Globulin Ratio 1.3 0.9 - 2.2   Lipase   Result Value Ref Range    Lipase <4 (L) 8 - 78 U/L   UA with reflex to micro (pts  3 + yrs)   Result Value Ref Range    Urine Type Clean Catch     Color, UA Colorless Colorless - Yellow    Clarity, UA Clear Clear - Hazy    Specific Gravity UA  1.027 1.001 - 1.035    Urine pH 6.0 5.0 - 8.0    Leukocyte Esterase, UA Negative Negative    Nitrite, UA Negative Negative    Protein, UR 30 (A) Negative    Glucose, UA >=500 (A) Negative    Ketones UA 80 (A) Negative    Urobilinogen, UA Negative 0.2 - 2.0 mg/dL    Bilirubin, UA Negative Negative    Blood, UA Negative Negative    RBC, UA 0 - 2 0 - 5 /hpf    WBC, UA 0 - 5 0 - 5 /hpf   Basic Metabolic Panel   Result Value Ref Range    Glucose 408 (H) 70 - 100 mg/dL    BUN 16 9 - 28 mg/dL    Creatinine 1.6 (H) 0.7 - 1.3 mg/dL    Calcium 60.4 (H) 8.5 - 10.5 mg/dL    Sodium 540 (L) 981 - 145 mEq/L    Potassium 5.4 (H) 3.5 - 5.1 mEq/L    Chloride 93 (L) 100 - 111 mEq/L    CO2 14 (L) 22 - 29 mEq/L    Anion Gap 28.0 (H) 5.0 - 15.0   GFR   Result Value Ref Range    EGFR >60.0    Glucose Whole Blood - POCT   Result Value Ref Range    POCT - Glucose Whole blood 352 (H) 70 - 100 mg/dL   Glucose Whole Blood - POCT   Result Value Ref Range    POCT - Glucose Whole blood 302 (H) 70 - 100 mg/dL       CRITICAL CARE/PROCEDURES    Procedures  CRITICAL CARE: The high probability of sudden, clinically significant deterioration in the patient's condition required the highest level of my preparedness to intervene urgently.    The services I provided to this patient were to treat and/or prevent clinically significant deterioration that could result in: Death.  Services included the following: chart data review, reviewing nursing notes and/or old charts, documentation time, consultant collaboration regarding findings and treatment options, medication orders and management, direct patient care, re-evaluations, vital sign assessments and ordering, interpreting and reviewing diagnostic studies/lab tests.    Aggregate critical care time was 35 minutes, which includes only time during which I was engaged in work directly related to the patient's care, as described above, whether  at the bedside or elsewhere in the Emergency Department.  It did not  include time spent performing other reported procedures or the services of residents, students, nurses or physician assistants.      DIAGNOSIS      Diagnosis:  Final diagnoses:   DKA, type 1, not at goal       Disposition:  ED Disposition     ED Disposition Condition Date/Time Comment    Admit  Fri Jul 07, 2018  8:55 PM Admitting Physician: Silva Bandy [[id number]]   Diagnosis: DKA, type 1, not at goal [[id number]]   Estimated Length of Stay: 3 - 5 Days   Tentative Discharge Plan?: Home or Self Care [1]   Patient Class: Inpatient [101]            Prescriptions:  Patient's Medications   New Prescriptions    No medications on file   Previous Medications    ACCU-CHEK FASTCLIX LANCETS MISC    Check blood sugar 7 times daily, as directed by MD.    ALBUTEROL (PROVENTIL HFA;VENTOLIN HFA) 108 (90 BASE) MCG/ACT INHALER    Inhale 2 puffs into the lungs every 6 (six) hours as needed for Wheezing or Shortness of Breath    FLUTICASONE FUROATE-VILANTEROL (BREO ELLIPTA) 200-25 MCG/INH AEROSOL POWDER, BREATH ACTIVTIVATEDE    Inhale 1 puff into the lungs daily    GLUCAGON (GLUCAGON EMERGENCY) 1 MG INJECTION    Inject 1 mg into the skin once as needed (hypoglycemia less than 50)    GLUCOSE BLOOD (ACCU-CHEK AVIVA PLUS) TEST STRIP    Check blood sugar 7 times daily, as directed by MD.    INSULIN GLARGINE (LANTUS) 100 UNIT/ML INJECTION    Inject 20 Units into the skin every 12 (twelve) hours    INSULIN LISPRO (HUMALOG) 100 UNIT/ML INJECTION    Administer via carb count protocal   Modified Medications    No medications on file   Discontinued Medications    No medications on file          Arther Abbott, DO  07/07/18 2134

## 2018-07-07 NOTE — ED Triage Notes (Signed)
BIBA re: altered mental status/diabetic. EMS responds to pt altered and flailing about non-coherent. IV placed and NS w/o for pt 100.1 tympanic and accu 368.  Pt now much more coherent per EMS. IDDM +. Pt now a good historian relating N/v and dizzy this day. Extreme thirst. Has no insulin to cover S/S.

## 2018-07-07 NOTE — EDIE (Signed)
COLLECTIVE?NOTIFICATION?07/07/2018 18:39?[name], [name]?MRN: [id number]    [facility] [facility]'s patient encounter information:   MRN:[id number]  Account [id number]  Billing Account [id number]      Criteria Met      5 ED Visits in 12 Months    Security and Safety  No recent Security Events currently on file    ED Care Guidelines  There are currently no ED Care Guidelines for this patient. Please check your facility's medical records system.        Prescription Monitoring Program  PDMP query found no report.  Narx Score not available at this time.      E.D. Visit Count (12 mo.)  Facility Visits   [facility] 5   [facility] 2   Total 7   Note: Visits indicate total known visits.      Recent Emergency Department Visit Summary  Date Facility [facility] Type Diagnoses or Chief Complaint   Jul 07, 2018 [place] [facility]      medic-      Jul 01, 2018 [place] [facility] [place] [facility]      Hyperglycemia      Hyperkalemia      Hypo-osmolality and hyponatremia      Disorder of kidney and ureter, unspecified      Oth diabetes mellitus with ketoacidosis without coma      Flu due to unidentified influenza virus w oth resp manifest      May 10, 2018 [place] [facility] [place] [facility] [place] [facility]      medic-      Laceration      Laceration w/o fb of l idx fngr w/o damage to nail, init      Unsp open wound of unsp finger w/o damage to nail, init      Lacerat extn musc/fasc/tend unsp finger at forarm lv, init      May 04, 2018 [place] [facility] [facility]      nausea, emesis      Nausea      Hyperglycemia      1 Type 1 diabetes mellitus with ketoacidosis without coma      Apr 19, 2018 [place] [facility] [place] [facility]      med refill      Medication Refill      Hyperglycemia, unspecified      Nov 24, 2017 [facility] [place] [place] [facility]     Sep 06, 2017 [facility] [place] [place] [facility]      Jul 25, 2017 [place] [facility] [employer] [facility]         Recent Inpatient Visit Summary  Date Facility [facility] Type Diagnoses or Chief Complaint   Jul 01, 2018 [place] [facility] [place] [facility]      Hyperkalemia      Oth diabetes mellitus with ketoacidosis without coma      Flu due to unidentified influenza virus w oth resp manifest      Hypo-osmolality and hyponatremia      Disorder of kidney and ureter, unspecified      May 04, 2018 [place] [facility]      1 Type 1 diabetes mellitus with ketoacidosis without coma          Care Team  There is not a  care team on record at this time.   Collective Portal  This patient has registered at the [facility] Emergency Department   For more information visit: [email or web address]     PLEASE NOTE:     1.   Any care recommendations and other clinical information are provided as guidelines or for historical purposes only, and providers should exercise their own clinical judgment when providing care.    2.   You may only use this information for purposes of treatment, payment or health care operations activities, and subject to the limitations of applicable Collective Policies.    3.   You should consult directly with the organization that provided a care guideline or other clinical history with any questions about additional information or accuracy or completeness of information provided.    ? 2020 [place], [employer] - [email or web address]

## 2018-07-07 NOTE — Progress Notes (Signed)
eICU Progress Note    Diagnoses managed:    1. DKA   2. AKI is likely due to volume depletion  3. Overall uncontrolled DM with multiple hospital admissions, HB aic  12.8   4. Leucocytosis likely related to DKA, but exclude infection eg Acute Bronchitis/ Pneumonia.   5. Recent infection with Influenza B. He completed 4 days of Tamiflu.   6. Difficulty accessing health care.  7. Asthma- mild intermittent.      Interventions:   1. Admit to the ICU  2. Insulin therapy per DKA protocol  3. IV fluid hydration  4. Strict IP/OP charting and monitoring/ and repletion of electrolytes.  5. Diabetic education, strongly recommend social work consult - pt does not have money to buy insulin.   6. Rpt CXR - prior ? Effusion on CXR of 07/02/2018.   7. F/U cbc in am, procalcitonin. Empiric Levaquin.    8. ID consult for opinion on duration of isolation for Influenza.     Critical Care services provided via tele-video technology and online EMR.  Time: 45 minutes    Keyundra Fant Margit Banda, MD      The patient is a 23 yo with Type 1 DM since childhood. He also has asthma- severity class is mild intermittent, he is not using his inhalers on a regular basis  once every few months. He smokes 1 cig 1-2 x week, drinks alcohol socially and smokes 3-4 joints of marijuana a day to feel better. He denies the use of E-cigarettes. The patient was recently discharged from [facility] on 07/03/2018 following treatment for DKA. He also had AKI due to volume depletion which resolved. He stated that he was taking his long acting insulin Lantus at 20 units and did not obtain of his short acting insulin.- He could not afford $ 400  to pay for the Humalog, and does not have a primary care physician or an endocrinologist.  He came to the ED via EMS- after his mother found him confused and agitated. In the ED this improved and the patient stated that he had nausea and vomiting with dizziness and polydipsia which started today.     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     VS- ICU hr 1054, reg, BP 132/73, RR- 22, O2 sat 100%, temp-100.5  General- the pt is a thin man, lying in bed in no distress. He is awake and oriented to time, place and person  HEENT - mm- pink, no jaundice, mildly dry., PERTL  Resp- chest is clear, no adventitious sounds, normal chest excursion.  Abd- soft, non-distended, non-tender, BS are normal.  CVS- hs regular, no reported murmurs, no LE edema.  CNS- the pt has no focal deficits, mildly sleepy moves all extremities, follows all commands well, sad affect.       Results for ALIM, CATTELL (MRN [id number]) as of 07/07/2018 20:45   Ref. Range 07/07/2018 19:24   WBC Latest Ref Range: 3.10 - 9.50 x10 3/uL 12.61 (H)   Hemoglobin Latest Ref Range: 12.5 - 17.1 g/dL 54.0   Hematocrit Latest Ref Range: 37.6 - 49.6 % 51.0 (H)   Platelet Count Latest Ref Range: 142 - 346 x10 3/uL 360 (H)   RBC Latest Ref Range: 4.20 - 5.90 x10 6/uL 6.38 (H)   MCV Latest Ref Range: 78.0 - 96.0 fL 79.9   MCH Latest Ref Range: 25.1 - 33.5 pg 25.4   MCHC Latest Ref Range: 31.5 -  35.8 g/dL 16.1   RDW Latest Ref Range: 11 - 15 % 12   MPV Latest Ref Range: 8.9 - 12.5 fL 11.1   Neutrophils Latest Ref Range: None % 83.3   Lymphocytes Automated Latest Ref Range: None % 11.9   Monocytes Latest Ref Range: None % 3.8   Eosinophils Automated Latest Ref Range: None % 0.0   Basophils Automated Latest Ref Range: None % 0.6   Immature Granulocyte Latest Ref Range: None % 0.4   Nucleated RBC Latest Ref Range: 0.0 - 0.0 /100 WBC 0.0   Neutro # Latest Ref Range: 1.10 - 6.33 x10 3/uL 10.51 (H)   Abs Lymph Automated Latest Ref Range: 0.42 - 3.22 x10 3/uL 1.50   Abs Eos Automated Latest Ref Range: 0.00 - 0.44 x10 3/uL 0.00   Abs Mono Automated Latest Ref Range: 0.21 - 0.85 x10 3/uL 0.48   Absolute Baso Automated Latest Ref Range: 0.00 - 0.08 x10 3/uL 0.07   Absolute Immature Granulocyte Latest Ref Range: 0.00 - 0.07 x10 3/uL 0.05   Absolute NRBC Latest Ref Range: 0.00 - 0.00 x10 3/uL  0.00   Glucose Latest Ref Range: 70 - 100 mg/dL 096 (H)   BUN Latest Ref Range: 9 - 28 mg/dL 16   Creatinine Latest Ref Range: 0.7 - 1.3 mg/dL 1.6 (H)   Sodium Latest Ref Range: 136 - 145 mEq/L 135 (L)   Potassium Latest Ref Range: 3.5 - 5.1 mEq/L 5.4 (H)   Chloride Latest Ref Range: 100 - 111 mEq/L 93 (L)   Carbon Dioxide, Whole Blood Latest Ref Range: 22 - 29 mEq/L 14 (L)   Calcium Latest Ref Range: 8.5 - 10.5 mg/dL 04.5 (H)   Anion Gap Latest Ref Range: 5.0 - 15.0  28.0 (H)   EGFR Unknown >60.0   AST Whole Blood Latest Ref Range: 5 - 34 U/L 43 (H)   ALT, Whole Blood Latest Ref Range: 0 - 55 U/L 36   Alkaline Phosphatase Latest Ref Range: 38 - 106 U/L 160 (H)   Albumin Latest Ref Range: 3.5 - 5.0 g/dL 4.8   Protein, Total Latest Ref Range: 6.0 - 8.3 g/dL 8.5 (H)   Globulin Latest Ref Range: 2.0 - 3.6 g/dL 3.7 (H)   Albumin/Globulin Ratio Latest Ref Range: 0.9 - 2.2  1.3   Bilirubin, Total Latest Ref Range: 0.2 - 1.2 mg/dL 0.9   Bilirubin, Direct Latest Ref Range: 0.0 - 0.5 mg/dL 0.4   Bilirubin, Indirect Latest Ref Range: 0.2 - 1.0 mg/dL 0.5   Lipase Latest Ref Range: 8 - 78 U/L <4 (L)   Urine Type Unknown Clean Catch   Color, UA Latest Ref Range: Colorless - Yellow  Colorless   Clarity, UA Latest Ref Range: Clear - Hazy  Clear   Specific Gravity, UA Latest Ref Range: 1.001 - 1.035  1.027   Urine pH Latest Ref Range: 5.0 - 8.0  6.0   Leukocyte Esterase, UA Latest Ref Range: Negative  Negative   Nitrite, UA Latest Ref Range: Negative  Negative   Protein, UR Latest Ref Range: Negative  30 (A)   Glucose, UA Latest Ref Range: Negative  >=500 (A)   Ketones UA Latest Ref Range: Negative  80 (A)   Urobilinogen, UA Latest Ref Range: 0.2 - 2.0 mg/dL Negative   Bilirubin, UA Latest Ref Range: Negative  Negative   Blood, UA Latest Ref Range: Negative  Negative   RBC UA Latest Ref Range: 0 - 5 /hpf 0 -  2   WBC, UA Latest Ref Range: 0 - 5 /hpf 0 - 5   RAPID INFLUENZA A/B ANTIGENS Unknown Rpt     History:  influenza    Technique: PA and Lateral    Comparison: None.    Findings:  There is slight blunting of the left costophrenic angle with minimal  linear airspace disease seen in the lingula  There is no pneumothorax.  The heart is normal in size.    The mediastinum is within normal limits.    There is a slight thoracic curvature of the spine convex right    IMPRESSION:      1. Minimal lingular airspace disease which may represent mild focal  scarring or pneumonitis  2. Slight blunting of left costophrenic angle which may indicate a trace  left pleural effusion or pleural thickening/scarring    Laurena Slimmer, MD   07/02/2018 10:33 AM    The patient is critically ill with Diabetic Ketoacidosis (DKA) and Acute kidney injury and requires high-complexity decision making for assessment and support including treatment as aove; Critical care time devoted to patient care services described in this note: 30 Minutes face-to-face / 45 Hour total evaluation time; Cumulative time devoted to patient care services by me for day of service: 45    Care during the described time interval was provided by me. I have reviewed this patient's available data, including medical history, events of note, physical examination and test results as part of my evaluation.

## 2018-07-08 LAB — GLUCOSE WHOLE BLOOD - POCT
Whole Blood Glucose POCT: 108 mg/dL — ABNORMAL HIGH (ref 70–100)
Whole Blood Glucose POCT: 112 mg/dL — ABNORMAL HIGH (ref 70–100)
Whole Blood Glucose POCT: 117 mg/dL — ABNORMAL HIGH (ref 70–100)
Whole Blood Glucose POCT: 136 mg/dL — ABNORMAL HIGH (ref 70–100)
Whole Blood Glucose POCT: 155 mg/dL — ABNORMAL HIGH (ref 70–100)
Whole Blood Glucose POCT: 155 mg/dL — ABNORMAL HIGH (ref 70–100)
Whole Blood Glucose POCT: 161 mg/dL — ABNORMAL HIGH (ref 70–100)
Whole Blood Glucose POCT: 165 mg/dL — ABNORMAL HIGH (ref 70–100)
Whole Blood Glucose POCT: 169 mg/dL — ABNORMAL HIGH (ref 70–100)
Whole Blood Glucose POCT: 175 mg/dL — ABNORMAL HIGH (ref 70–100)
Whole Blood Glucose POCT: 283 mg/dL — ABNORMAL HIGH (ref 70–100)
Whole Blood Glucose POCT: 299 mg/dL — ABNORMAL HIGH (ref 70–100)

## 2018-07-08 LAB — COMPREHENSIVE METABOLIC PANEL
ALT: 27 U/L (ref 0–55)
AST (SGOT): 31 U/L (ref 5–34)
Albumin/Globulin Ratio: 1.3 (ref 0.9–2.2)
Albumin: 3.5 g/dL (ref 3.5–5.0)
Alkaline Phosphatase: 107 U/L — ABNORMAL HIGH (ref 38–106)
Anion Gap: 17 — ABNORMAL HIGH (ref 5.0–15.0)
BUN: 11 mg/dL (ref 9–28)
Bilirubin, Total: 0.6 mg/dL (ref 0.2–1.2)
CO2: 18 mEq/L — ABNORMAL LOW (ref 22–29)
Calcium: 8.9 mg/dL (ref 8.5–10.5)
Chloride: 102 mEq/L (ref 100–111)
Creatinine: 1.2 mg/dL (ref 0.7–1.3)
Globulin: 2.7 g/dL (ref 2.0–3.6)
Glucose: 177 mg/dL — ABNORMAL HIGH (ref 70–100)
Potassium: 4.4 mEq/L (ref 3.5–5.1)
Protein, Total: 6.2 g/dL (ref 6.0–8.3)
Sodium: 137 mEq/L (ref 136–145)

## 2018-07-08 LAB — BASIC METABOLIC PANEL
Anion Gap: 11 (ref 5.0–15.0)
Anion Gap: 14 (ref 5.0–15.0)
BUN: 9 mg/dL (ref 9–28)
BUN: 8 mg/dL — ABNORMAL LOW (ref 9–28)
CO2: 20 mEq/L — ABNORMAL LOW (ref 22–29)
CO2: 19 mEq/L — ABNORMAL LOW (ref 22–29)
Calcium: 8.5 mg/dL (ref 8.5–10.5)
Calcium: 9 mg/dL (ref 8.5–10.5)
Chloride: 102 mEq/L (ref 100–111)
Chloride: 101 mEq/L (ref 100–111)
Creatinine: 1.1 mg/dL (ref 0.7–1.3)
Creatinine: 1.1 mg/dL (ref 0.7–1.3)
Glucose: 144 mg/dL — ABNORMAL HIGH (ref 70–100)
Glucose: 273 mg/dL — ABNORMAL HIGH (ref 70–100)
Potassium: 3.9 mEq/L (ref 3.5–5.1)
Potassium: 4.4 mEq/L (ref 3.5–5.1)
Sodium: 133 mEq/L — ABNORMAL LOW (ref 136–145)
Sodium: 134 mEq/L — ABNORMAL LOW (ref 136–145)

## 2018-07-08 LAB — CBC
Absolute NRBC: 0 10*3/uL (ref 0.00–0.00)
Hematocrit: 37.5 % — ABNORMAL LOW (ref 37.6–49.6)
Hgb: 12.4 g/dL — ABNORMAL LOW (ref 12.5–17.1)
MCH: 26 pg (ref 25.1–33.5)
MCHC: 33.1 g/dL (ref 31.5–35.8)
MCV: 78.6 fL (ref 78.0–96.0)
MPV: 9.8 fL (ref 8.9–12.5)
Nucleated RBC: 0 /100 WBC (ref 0.0–0.0)
Platelets: 312 10*3/uL (ref 142–346)
RBC: 4.77 10*6/uL (ref 4.20–5.90)
RDW: 12 % (ref 11–15)
WBC: 12.94 10*3/uL — ABNORMAL HIGH (ref 3.10–9.50)

## 2018-07-08 LAB — MAGNESIUM
Magnesium: 1.7 mg/dL (ref 1.6–2.6)
Magnesium: 1.7 mg/dL (ref 1.6–2.6)
Magnesium: 1.8 mg/dL (ref 1.6–2.6)
Magnesium: 2.4 mg/dL (ref 1.6–2.6)

## 2018-07-08 LAB — GFR
EGFR: >60
EGFR: >60
EGFR: >60

## 2018-07-08 LAB — PT AND APTT
PT INR: 1.1 (ref 0.9–1.1)
PT: 13.9 s (ref 12.6–15.0)
PTT: 30 s (ref 23–37)

## 2018-07-08 LAB — PROCALCITONIN: Procalcitonin: 0.9 — ABNORMAL HIGH (ref 0.00–0.10)

## 2018-07-08 LAB — PHOSPHORUS
Phosphorus: 2 mg/dL — ABNORMAL LOW (ref 2.3–4.7)
Phosphorus: 2.5 mg/dL (ref 2.3–4.7)
Phosphorus: 2.6 mg/dL (ref 2.3–4.7)
Phosphorus: 2.7 mg/dL (ref 2.3–4.7)

## 2018-07-08 MED ORDER — DEXTROSE 10 % IV BOLUS
250.00 mL | Freq: Once | INTRAVENOUS | Status: DC
Start: 2018-07-08 — End: 2018-07-08

## 2018-07-08 MED ORDER — GLUCOSE 40 % PO GEL
15.00 g | ORAL | Status: DC | PRN
Start: 2018-07-08 — End: 2018-07-09

## 2018-07-08 MED ORDER — INSULIN LISPRO 100 UNIT/ML SC SOLN
1.00 [IU] | Freq: Three times a day (TID) | SUBCUTANEOUS | Status: DC
Start: 2018-07-08 — End: 2018-07-09
  Administered 2018-07-08: 12:00:00 3 [IU] via SUBCUTANEOUS
  Administered 2018-07-09: 08:00:00 1 [IU] via SUBCUTANEOUS
  Filled 2018-07-08: qty 3
  Filled 2018-07-08: qty 9

## 2018-07-08 MED ORDER — GLUCAGON 1 MG IJ SOLR (WRAP)
1.00 mg | INTRAMUSCULAR | Status: DC | PRN
Start: 2018-07-08 — End: 2018-07-09

## 2018-07-08 MED ORDER — INSULIN GLARGINE 100 UNIT/ML SC SOLN
15.00 [IU] | Freq: Every morning | SUBCUTANEOUS | Status: DC
Start: 2018-07-09 — End: 2018-07-08

## 2018-07-08 MED ORDER — INSULIN GLARGINE 100 UNIT/ML SC SOLN
15.00 [IU] | Freq: Once | SUBCUTANEOUS | Status: AC
Start: 2018-07-08 — End: 2018-07-08
  Administered 2018-07-08: 06:00:00 15 [IU] via SUBCUTANEOUS
  Filled 2018-07-08: qty 15

## 2018-07-08 MED ORDER — DEXTROSE 10 % IV BOLUS
125.00 mL | INTRAVENOUS | Status: DC | PRN
Start: 2018-07-08 — End: 2018-07-09

## 2018-07-08 MED ORDER — INSULIN LISPRO 100 UNIT/ML SC SOLN
5.00 [IU] | Freq: Three times a day (TID) | SUBCUTANEOUS | Status: DC
Start: 2018-07-08 — End: 2018-07-09
  Administered 2018-07-08 – 2018-07-09 (×3): 5 [IU] via SUBCUTANEOUS
  Filled 2018-07-08 (×3): qty 15

## 2018-07-08 MED ORDER — INSULIN LISPRO 100 UNIT/ML SC SOLN
3.00 [IU] | Freq: Three times a day (TID) | SUBCUTANEOUS | Status: DC
Start: 2018-07-08 — End: 2018-07-08

## 2018-07-08 MED ORDER — INSULIN LISPRO 100 UNIT/ML SC SOLN
1.00 [IU] | Freq: Every evening | SUBCUTANEOUS | Status: DC
Start: 2018-07-08 — End: 2018-07-09
  Administered 2018-07-08: 21:00:00 2 [IU] via SUBCUTANEOUS
  Filled 2018-07-08: qty 6

## 2018-07-08 MED ORDER — INSULIN GLARGINE 100 UNIT/ML SC SOLN
15.00 [IU] | Freq: Two times a day (BID) | SUBCUTANEOUS | Status: DC
Start: 2018-07-08 — End: 2018-07-09
  Administered 2018-07-08 – 2018-07-09 (×2): 15 [IU] via SUBCUTANEOUS
  Filled 2018-07-08 (×2): qty 15

## 2018-07-08 MED ORDER — MAGNESIUM SULFATE IN D5W 1-5 GM/100ML-% IV SOLN
1.0000 g | INTRAVENOUS | Status: AC
Start: 2018-07-08 — End: 2018-07-08
  Administered 2018-07-08 (×2): 1 g via INTRAVENOUS
  Filled 2018-07-08: qty 200

## 2018-07-08 MED ORDER — DEXTROSE-NACL 5-0.9 % IV SOLN
INTRAVENOUS | Status: DC
Start: 2018-07-08 — End: 2018-07-08

## 2018-07-08 MED ORDER — DEXTROSE-NACL 5-0.45 % IV SOLN
INTRAVENOUS | Status: DC
Start: 2018-07-08 — End: 2018-07-08

## 2018-07-08 NOTE — Progress Notes (Signed)
Report and pt received from [facility] from CCU.  V/s stable upon arrival.  Pt oriented to unit and call bell use.  Droplet isolation to continue.

## 2018-07-08 NOTE — Transfer Summary (Signed)
Patient been alert and  oriented, no pain throughout the day blood sugar continued AC and HS, and covered per order. Vital been stable and void urinal yellowish concentrated urine. Encouraged to drink water but he just sip some. Continued on isolation until he is cleared and report given to [facility] to the unit room 409 Nurse.

## 2018-07-08 NOTE — H&P (Signed)
Critical Care Attending Note: Silva Bandy, MD     [facility]  Critical Care Progress Note   Note Date: 07/08/2018  Note Time: 9:55 AM    Patient: [name] [name].  MRN: [id number]  Age/Sex: 23 y.o. male  DOB: 07/06/1995  Code Status: Full Code  Date of Admission: 07/07/2018       Brief Historical Review:   [name] Poorman. is a 23 y.o. male h/o T1DM (age 78), multiple recent admissions for DKA, does not follow any physician on a regular basis, asthma, smoker with insulin regimen of 20 units Lantus BID with NovoLog sliding scale by carb counting, admitted to the ICU in DKA.     Most recent admission 12/28 - 12/30 for DKA with flu B.    Patient reports that he has not been able to fill his insulin due to cost prohibitive issues.  Has had abdominal discomfort with nausea and vomiting.    In the ED evening 07/07/18:    Glucose 473, bicarb 14, AG 28.  Creatinine 1.6.  Potassium 5.4.  Given IV fluids, insulin bolus and drip, admitted to the ICU.    07/08/18:    AG 11, bicarb 20.  Symptoms resolved.  Weaned off of insulin drip, started on Lantus 15 units twice daily, plus lispro 5 units 3 times daily with meals and low-dose lispro sliding scale.  Stable for downgrade out of the ICU.     Key Recent Events:     See above  Physical Exam:     Vital Signs:  BP 112/69   Pulse (!) 103   Temp 100.5 F (38.1 C) (Oral)   Resp (!) 26   Ht 1.727 m (5\' 8" )   Wt 57.5 kg (126 lb 12.2 oz)   SpO2 100%   BMI 19.27 kg/m     General: Well-developed.  No distress.  Neuro: Alert and oriented x4, moves all extremities  Heart: S1/S2 no murmurs  Lungs: CTAB no respiratory distress  Abdomen: Soft, nontender, nondistended  Extremities: No peripheral edema  Skin: Warm, well perfused  Catheters/Drains: none    Ventilator Settings:      Radiologic Images Reviewed:     All imaging reviewed since admission    Input / Output:              Intake/Output Summary (Last 24 hours) at 07/08/2018 0955  Last data filed at 07/08/2018 0600  Gross per  24 hour   Intake 2815.77 ml   Output 400 ml   Net 2415.77 ml    Critical Care Issues:    1. Diabetic ketoacidosis  2. History of type 1 diabetes, HgbA1c is 12.8  3. Intermittent tobacco smoker  4. History of asthma  5. Acute kidney injury  6. Dehydration/volume depletion  7. High anion gap metabolic acidosis  8. Financial insecurity  9. Leukocytosis  10. Recent influenza B    Main ICU Plans:     Neuro:  - tylenol prn pain      Respiratory:  - continue breo ellipta      Cardiovascular:   - stable, monitor      GI:  - Bowel regimen: none  - Nutrition: carb controlled diet  - GI ppx: not indicated  - zofran prn nausea      Renal & Endo:  - Monitor UOP, strict I/Os, trend BUN/Cr, daily weights  - Monitor and correct lytes prn  - mIVF: kvo  - Check BMP/mg/phos at noon,  then daily  - aggressive repletion of electrolytes, especially K, Mg and Phos as the patient is total-body depleted.    - completed DKA protocol  - transitioned off insulin gtt  - on lantus 15u BID (home regimen 20u BID)  - lispro 5u TID meals for now, plus low dose sliding scale, likely can be increased once renal function fully recovers (carb counts at home, states that he would usually take about 8 units for 75gm carbs)       Hematology:  - DVT ppx: SCDs and lovenox ppx      ID:  - Monitor fever curve, trend WBC  - stop tamiflu. No benefit.     Dispo: admitted to the ICU in th evening of 1/3, stable for downgrade today 1/4    Family/Goals of Care: updated patient at bedside    My cumulative time taking care of this critically ill patient (exclusive of procedures) is 40 minutes.    Greater than 50% of this time was spent in counseling and/or coordination of care regarding current medical management and future plan of care.         Hospital Problem List:  Active Hospital Problems    Diagnosis   . DKA, type 1, not at goal     Scheduled, Infusion and PRN Medications:  Current Facility-Administered Medications   Medication Dose Route Frequency   . enoxaparin   40 mg Subcutaneous Daily   . fluticasone furoate-vilanterol  1 puff Inhalation QAM   . [START ON 07/09/2018] insulin glargine  15 Units Subcutaneous QAM   . insulin lispro  1-3 Units Subcutaneous QHS   . insulin lispro  1-5 Units Subcutaneous TID AC   . insulin lispro  5 Units Subcutaneous TID MEALS     Current Facility-Administered Medications   Medication Dose Route Frequency Last Rate     Current Facility-Administered Medications   Medication Dose Route   . albuterol  2 puff Inhalation   . dextrose  15 g of glucose Oral    And   . glucagon (rDNA)  1 mg Intramuscular    And   . dextrose  125 mL Intravenous       Labs & Micro reviewed from past 24 hours:  Results     Procedure Component Value Units Date/Time    Glucose Whole Blood - POCT [[id number]]  (Abnormal) Collected:  07/08/18 0806     Updated:  07/08/18 0814     POCT - Glucose Whole blood 165 mg/dL     Glucose Whole Blood - POCT [[id number]]  (Abnormal) Collected:  07/08/18 0658     Updated:  07/08/18 0703     POCT - Glucose Whole blood 136 mg/dL     Glucose Whole Blood - POCT [[id number]]  (Abnormal) Collected:  07/08/18 0604     Updated:  07/08/18 0614     POCT - Glucose Whole blood 108 mg/dL     Glucose Whole Blood - POCT [[id number]]  (Abnormal) Collected:  07/08/18 0503     Updated:  07/08/18 0507     POCT - Glucose Whole blood 112 mg/dL     Basic Metabolic Panel [[id number]]  (Abnormal) Collected:  07/08/18 0432    Specimen:  Blood Updated:  07/08/18 0501     Glucose 144 mg/dL      BUN 9 mg/dL      Creatinine 1.1 mg/dL      Calcium 8.5 mg/dL      Sodium 244 mEq/L  Potassium 3.9 mEq/L      Chloride 102 mEq/L      CO2 20 mEq/L      Anion Gap 11.0    Narrative:       To begin when Anion Gap is less than 12.    Magnesium [[id number]] Collected:  07/08/18 0432    Specimen:  Blood Updated:  07/08/18 0501     Magnesium 2.4 mg/dL     Narrative:       To begin when Anion Gap is less than 12.    Phosphorus [[id number]] Collected:  07/08/18 0432    Specimen:  Blood  Updated:  07/08/18 0501     Phosphorus 2.5 mg/dL     Narrative:       To begin when Anion Gap is less than 12.    GFR [[id number]] Collected:  07/08/18 0432     Updated:  07/08/18 0501     EGFR >60.0    Narrative:       To begin when Anion Gap is less than 12.    PT/APTT [[id number]] Collected:  07/08/18 0432     Updated:  07/08/18 0455     PT 13.9 sec      PT INR 1.1     PTT 30 sec     CBC without differential [[id number]]  (Abnormal) Collected:  07/08/18 0432    Specimen:  Blood Updated:  07/08/18 0450     WBC 12.94 x10 3/uL      Hgb 12.4 g/dL      Hematocrit 24.4 %      Platelets 312 x10 3/uL      RBC 4.77 x10 6/uL      MCV 78.6 fL      MCH 26.0 pg      MCHC 33.1 g/dL      RDW 12 %      MPV 9.8 fL      Nucleated RBC 0.0 /100 WBC      Absolute NRBC 0.00 x10 3/uL     Narrative:       To begin when Anion Gap is less than 12.    Procalcitonin [[id number]]  (Abnormal) Collected:  07/08/18 0004     Updated:  07/08/18 0435     Procalcitonin 0.90    Narrative:       Nurse is going to change order to 12am 07/07/2018  23:09    Glucose Whole Blood - POCT [[id number]]  (Abnormal) Collected:  07/08/18 0408     Updated:  07/08/18 0430     POCT - Glucose Whole blood 155 mg/dL     Glucose Whole Blood - POCT [[id number]]  (Abnormal) Collected:  07/08/18 0305     Updated:  07/08/18 0322     POCT - Glucose Whole blood 169 mg/dL     Glucose Whole Blood - POCT [[id number]]  (Abnormal) Collected:  07/08/18 0201     Updated:  07/08/18 0208     POCT - Glucose Whole blood 155 mg/dL     Glucose Whole Blood - POCT [[id number]]  (Abnormal) Collected:  07/08/18 0100     Updated:  07/08/18 0110     POCT - Glucose Whole blood 161 mg/dL     Glucose Whole Blood - POCT [[id number]]  (Abnormal) Collected:  07/08/18 0000     Updated:  07/08/18 0059     POCT - Glucose Whole blood 175 mg/dL     Comprehensive metabolic panel [[id number]]  (Abnormal) Collected:  07/08/18 0004  Specimen:  Blood Updated:  07/08/18 0045     Glucose 177 mg/dL      BUN 11 mg/dL       Creatinine 1.2 mg/dL      Sodium 119 mEq/L      Potassium 4.4 mEq/L      Chloride 102 mEq/L      CO2 18 mEq/L      Calcium 8.9 mg/dL      Protein, Total 6.2 g/dL      Albumin 3.5 g/dL      AST (SGOT) 31 U/L      ALT 27 U/L      Alkaline Phosphatase 107 U/L      Bilirubin, Total 0.6 mg/dL      Globulin 2.7 g/dL      Albumin/Globulin Ratio 1.3     Anion Gap 17.0    Magnesium [[id number]] Collected:  07/08/18 0004    Specimen:  Blood Updated:  07/08/18 0045     Magnesium 1.7 mg/dL     Phosphorus [[id number]] Collected:  07/08/18 0004    Specimen:  Blood Updated:  07/08/18 0045     Phosphorus 2.6 mg/dL     GFR [[id number]] Collected:  07/08/18 0004     Updated:  07/08/18 0045     EGFR >60.0    Magnesium [[id number]] Collected:  07/08/18 0004    Specimen:  Blood Updated:  07/08/18 0037     Magnesium 1.7 mg/dL     Narrative:       To begin when Anion Gap is less than 12.    Phosphorus [[id number]] Collected:  07/08/18 0004    Specimen:  Blood Updated:  07/08/18 0037     Phosphorus 2.7 mg/dL     Narrative:       To begin when Anion Gap is less than 12.    Beta-Hydroxybutyrate [[id number]] Collected:  07/08/18 0004     Updated:  07/08/18 0013    Glucose Whole Blood - POCT [[id number]]  (Abnormal) Collected:  07/07/18 2320     Updated:  07/07/18 2349     POCT - Glucose Whole blood 215 mg/dL     Glucose Whole Blood - POCT [[id number]]  (Abnormal) Collected:  07/07/18 2336     Updated:  07/07/18 2349     POCT - Glucose Whole blood 155 mg/dL     Glucose Whole Blood - POCT [[id number]]  (Abnormal) Collected:  07/07/18 2306     Updated:  07/07/18 2349     POCT - Glucose Whole blood 60 mg/dL     Glucose Whole Blood - POCT [[id number]]  (Abnormal) Collected:  07/07/18 2310     Updated:  07/07/18 2349     POCT - Glucose Whole blood 53 mg/dL     APTT [[id number]] Collected:  07/07/18 1924     Updated:  07/07/18 2246     PTT 32 sec     Glucose Whole Blood - POCT [[id number]]  (Abnormal) Collected:  07/07/18 2143     Updated:  07/07/18 2211      POCT - Glucose Whole blood 245 mg/dL     Glucose Whole Blood - POCT [[id number]]  (Abnormal) Collected:  07/07/18 2046     Updated:  07/07/18 2048     POCT - Glucose Whole blood 302 mg/dL     GFR [[id number]] Collected:  07/07/18 1924     Updated:  07/07/18 2021     EGFR >60.0    Basic Metabolic Panel [[id number]]  (Abnormal)  Collected:  07/07/18 1924    Specimen:  Blood Updated:  07/07/18 2021     Glucose 408 mg/dL      BUN 16 mg/dL      Creatinine 1.6 mg/dL      Calcium 09.6 mg/dL      Sodium 045 mEq/L      Potassium 5.4 mEq/L      Chloride 93 mEq/L      CO2 14 mEq/L      Anion Gap 28.0    Hepatic function panel (LFT) [[id number]]  (Abnormal) Collected:  07/07/18 1924    Specimen:  Blood Updated:  07/07/18 2002     Bilirubin, Total 0.9 mg/dL      Bilirubin, Direct 0.4 mg/dL      Bilirubin, Indirect 0.5 mg/dL      AST (SGOT) 43 U/L      ALT 36 U/L      Alkaline Phosphatase 160 U/L      Protein, Total 8.5 g/dL      Albumin 4.8 g/dL      Globulin 3.7 g/dL      Albumin/Globulin Ratio 1.3    Lipase [[id number]]  (Abnormal) Collected:  07/07/18 1924    Specimen:  Blood Updated:  07/07/18 2002     Lipase <4 U/L     Rapid Influenza A/B Antigens [[id number]] Collected:  07/07/18 1924    Specimen:  Nasopharyngeal from Nasal Aspirate Updated:  07/07/18 1949    Narrative:       ORDER#: [id number]                                    ORDERED BY: KINDER, NATHAN  SOURCE: Nasal Aspirate                               COLLECTED:  07/07/18 19:24  ANTIBIOTICS AT COLL.:                                RECEIVED :  07/07/18 19:32  Influenza Rapid Antigen A&B                FINAL       07/07/18 19:49  07/07/18   Negative for Influenza A and B             Reference Range: Negative      UA with reflex to micro (pts  3 + yrs) [[id number]]  (Abnormal) Collected:  07/07/18 1924    Specimen:  Urine Updated:  07/07/18 1941     Urine Type Clean Catch     Color, UA Colorless     Clarity, UA Clear     Specific Gravity UA 1.027     Urine pH 6.0     Leukocyte  Esterase, UA Negative     Nitrite, UA Negative     Protein, UR 30     Glucose, UA >=500     Ketones UA 80     Urobilinogen, UA Negative mg/dL      Bilirubin, UA Negative     Blood, UA Negative     RBC, UA 0 - 2 /hpf      WBC, UA 0 - 5 /hpf     CBC and differential [[id number]]  (Abnormal) Collected:  07/07/18 1924    Specimen:  Blood Updated:  07/07/18 1937     WBC 12.61 x10 3/uL      Hgb 16.2 g/dL      Hematocrit 16.1 %      Platelets 360 x10 3/uL      RBC 6.38 x10 6/uL      MCV 79.9 fL      MCH 25.4 pg      MCHC 31.8 g/dL      RDW 12 %      MPV 11.1 fL      Neutrophils 83.3 %      Lymphocytes Automated 11.9 %      Monocytes 3.8 %      Eosinophils Automated 0.0 %      Basophils Automated 0.6 %      Immature Granulocyte 0.4 %      Nucleated RBC 0.0 /100 WBC      Neutrophils Absolute 10.51 x10 3/uL      Abs Lymph Automated 1.50 x10 3/uL      Abs Mono Automated 0.48 x10 3/uL      Abs Eos Automated 0.00 x10 3/uL      Absolute Baso Automated 0.07 x10 3/uL      Absolute Immature Granulocyte 0.05 x10 3/uL      Absolute NRBC 0.00 x10 3/uL     Glucose Whole Blood - POCT [[id number]]  (Abnormal) Collected:  07/07/18 1851     Updated:  07/07/18 1904     POCT - Glucose Whole blood 352 mg/dL         Microbiology:  Microbiology Results     Procedure Component Value Units Date/Time    Rapid Influenza A/B Antigens [[id number]] Collected:  07/07/18 1924    Specimen:  Nasopharyngeal from Nasal Aspirate Updated:  07/07/18 1949    Narrative:       ORDER#: [id number]                                    ORDERED BY: KINDER, NATHAN  SOURCE: Nasal Aspirate                               COLLECTED:  07/07/18 19:24  ANTIBIOTICS AT COLL.:                                RECEIVED :  07/07/18 19:32  Influenza Rapid Antigen A&B                FINAL       07/07/18 19:49  07/07/18   Negative for Influenza A and B             Reference Range: Negative          Radiologic Image Reports Reviewed:  XR Chest AP Portable   Final Result    No acute process  seen.      Charlene Brooke, MD    07/07/2018 10:27 PM        I have reviewed relevant laboratory results, microbiology results, consultation notes, outside records, pathology results and other progress notes which may not be part of this note.  Radiology images and all laboratory data were independently reviewed in the EHR.

## 2018-07-08 NOTE — Plan of Care (Signed)
Problem: Safety  Goal: Patient will be free from injury during hospitalization  Outcome: Progressing  Flowsheets (Taken 07/08/2018 1646)  Patient will be free from injury during hospitalization : Hourly rounding; Provide and maintain safe environment  Note:   Bed in low position, rails up x3, call bell within reach.       Problem: Safety  Goal: Patient will be free from infection during hospitalization  Outcome: Progressing  Flowsheets (Taken 07/08/2018 1646)  Free from Infection during hospitalization: Assess and monitor for signs and symptoms of infection; Monitor lab/diagnostic results  Note:   V/s stable, afebrile.  Continue to monitor labs. Accuchecks continue.     Problem: Discharge Barriers  Goal: Patient will be discharged home or other facility with appropriate resources  Outcome: Progressing  Flowsheets (Taken 07/08/2018 1646)  Discharge to home or other facility with appropriate resources: Initiate discharge planning; Provide appropriate patient education  Note:   Continue to assess needs for discharge.  Questions encouraged.     Problem: Diabetes: Glucose Imbalance  Goal: Blood glucose stable at established goal  Outcome: Progressing  Flowsheets (Taken 07/08/2018 1646)  Blood glucose stable at established goal : Ensure adequate hydration; Ensure appropriate diet and assess tolerance; Monitor lab values  Note:   Continue accuchecks.  Gave pt fresh water and diet gingerale, encouraged po fluids.

## 2018-07-08 NOTE — Plan of Care (Signed)
Problem: Safety  Goal: Patient will be free from injury during hospitalization  Outcome: Progressing  Flowsheets (Taken 07/08/2018 [phone])  Patient will be free from injury during hospitalization : Assess patient's risk for falls and implement fall prevention plan of care per policy; Provide and maintain safe environment; Ensure appropriate safety devices are available at the bedside; Use appropriate transfer methods; Include patient/ family/ care giver in decisions related to safety; Hourly rounding  Goal: Patient will be free from infection during hospitalization  Outcome: Progressing  Flowsheets (Taken 07/08/2018 0646)  Free from Infection during hospitalization: Assess and monitor for signs and symptoms of infection; Monitor lab/diagnostic results; Monitor all insertion sites (i.e. indwelling lines, tubes, urinary catheters, and drains); Encourage patient and family to use good hand hygiene technique  Note:   Patient on isolation for flu.     Problem: Pain  Goal: Pain at adequate level as identified by patient  Outcome: Progressing  Flowsheets (Taken 07/08/2018 0646)  Pain at adequate level as identified by patient: Identify patient comfort function goal; Assess pain on admission, during daily assessment and/or before any "as needed" intervention(s); Evaluate if patient comfort function goal is met; Evaluate patient's satisfaction with pain management progress; Offer non-pharmacological pain management interventions     Problem: Side Effects from Pain Analgesia  Goal: Patient will experience minimal side effects of analgesic therapy  Outcome: Progressing  Flowsheets (Taken 07/08/2018 0646)  Patient will experience minimal side effects of analgesic therapy: Monitor/assess patient's respiratory status (RR depth, effort, breath sounds); Assess for changes in cognitive function     Problem: Discharge Barriers  Goal: Patient will be discharged home or other facility with appropriate resources  Outcome:  Progressing  Flowsheets (Taken 07/08/2018 0646)  Discharge to home or other facility with appropriate resources: Provide appropriate patient education; Provide information on available health resources  Note:   Patient needs social worker assistance to make sure he gets his meds after discharge.     Problem: Psychosocial and Spiritual Needs  Goal: Demonstrates ability to cope with hospitalization/illness  Outcome: Progressing  Flowsheets (Taken 07/08/2018 [phone])  Demonstrates ability to cope with hospitalizations/illness: Encourage verbalization of feelings/concerns/expectations; Provide quiet environment; Assist patient to identify own strengths and abilities; Encourage patient to set small goals for self; Encourage participation in diversional activity; Reinforce positive adaptation of new coping behaviors; Include patient/ patient care companion in decisions     Problem: Fluid and Electrolyte Imbalance/ Endocrine  Goal: Fluid and electrolyte balance are achieved/maintained  Outcome: Progressing  Flowsheets (Taken 07/08/2018 0646)  Fluid and electrolyte balance are achieved/maintained: Monitor intake and output every shift; Monitor/assess lab values and report abnormal values; Provide adequate hydration; Assess for confusion/personality changes; Assess and reassess fluid and electrolyte status; Observe for cardiac arrhythmias  Note:   Lab monitoring per DKA protocol.  Goal: Adequate hydration  Outcome: Progressing  Flowsheets (Taken 07/08/2018 0646)  Adequate hydration: Assess mucus membranes, skin color, turgor, perfusion and presence of edema; Assess for peripheral, sacral, periorbital and abdominal edema; Monitor and assess vital signs and perfusion     Problem: Nutrition  Goal: Nutritional intake is adequate  Outcome: Progressing  Flowsheets (Taken 07/08/2018 0646)  Nutritional intake is adequate: Allow adequate time for meals; Assist patient with meals/food selection; Encourage/perform oral hygiene as appropriate;  Include patient/patient care companion in decisions related to nutrition  Note:   Patient now on diabetic diet, eating well.     Problem: Diabetes: Glucose Imbalance  Goal: Blood glucose stable at established goal  Outcome:  Progressing  Flowsheets (Taken 07/08/2018 0646)  Blood glucose stable at established goal : Monitor lab values; Monitor intake and output.  Notify LIP if urine output is < 30 mL/hour.; Follow fluid restrictions/IV/PO parameters; Include patient/family in decisions related to nutrition/dietary selections; Assess for hypoglycemia /hyperglycemia; Monitor/assess vital signs; Coordinate medication administration with meals, as indicated; Ensure appropriate diet and assess tolerance; Ensure adequate hydration; Ensure appropriate consults are obtained (Nutrition, Diabetes Education, and Case Management); Ensure patient/family has adequate teaching materials  Note:   Patient states that he was unable to afford his regular insulin and has been admitted several times in the last few weeks with similar symptoms. Needs continued education and social worker to review needs.

## 2018-07-08 NOTE — Progress Notes (Signed)
Patient arrived from the ED on insulin gtt and NS bolus ongoing, alert and oriented, on room air, denies any pain at this time.

## 2018-07-08 NOTE — Progress Notes (Signed)
MEDICINE PROGRESS NOTE  [facility], DIVISION OF HOSPITALIST MEDICINE   [place] [facility]   [place] Pager: [phone]      Date Time: 07/08/18 11:34 AM  Patient Name: [name] [name], [name] [name].  Attending Physician: Silva Bandy, MD  Hospital Day: 2  Assessment:     Active Hospital Problems    Diagnosis   . DKA, type 1, not at goal     Tobacco use  Recent Influenza     Plan:   23 year old male with known history of diabetes type 1 on insulin, unable to use Humalog as an outpatient due to financial reasons and presented with diabetic ketoacidosis with altered mental status.    -Diabetes type 1 uncontrolled and DKA  Patient is already much improved, resumed on lower dose of Lantus along with low-dose Humalog, blood glucose variable, continue to monitor today and expect that if blood sugar stable and clinical status improved and will discharge in a.m.  Will request case management consult to assist with discharge planning  Also shared with patient information about good Rx and discount prescriptions which she was not aware of  He has not started following with any of the transitional clinics or other local clinics that he has been recommended    -Hyperkalemia and acute kidney failure related to DKA resolved    Case discussed with:Pt, staff      Safety Checklist:     DVT prophylaxis:  CHEST guideline (See page [id number]) Chemical and Mechanical   Foley:  [place] Rn Foley protocol Not present   IVs:  Peripheral IV   PT/OT: Not needed     Lines:     Patient Lines/Drains/Airways Status    Active PICC Line / CVC Line / PIV Line / Drain / Airway / Intraosseous Line / Epidural Line / ART Line / Line / Wound / Pressure Ulcer / NG/OG Tube     Name:   Placement date:   Placement time:   Site:   Days:    Peripheral IV 07/07/18 Left Forearm   07/07/18    1839    Forearm   less than 1    Peripheral IV 07/07/18 Right Forearm   07/07/18    2230    Forearm   less than 1                 Disposition:     Today's date: 07/08/2018   Length of Stay: 1  Anticipated medical stability for discharge: 1 day   Reason for ongoing hospitalization:  As above     Subjective     CC: <principal problem not specified>  Interval History/24 hour events:   Diet consistent carbohydrate  Supervise For Meals Frequency: All meals  HPI/Subjective: Patient indicates his cough and congestion is better after recent influenza treatment, he shares he did complete the Tamiflu otherwise he is feeling better, denies any nausea or vomiting, able to eat better and denies pain or other new symptoms    Review of Systems:   Review of Systems - Negative except as above in HPI    Physical Exam:     Temp:  [99 F (37.2 C)-100.5 F (38.1 C)] 100.5 F (38.1 C)  Heart Rate:  [87-132] 97  Resp Rate:  [17-26] 22  BP: (112-137)/(53-74) 119/64  Intake and Output Summary-last 24 Hrs:  I/O last 3 completed shifts:  In: 2815.77 [I.V.:2365.77; IV Piggyback:450]  Out: 400 [Urine:400]     General: awake, alert thin male ,in  no acute distress, multiple tattoos noted   Cardiovascular: regular rate and rhythm, no murmurs  Lungs: clear to auscultation bilaterally, no additional sounds  Abdomen: soft, non-tender, non-distended; normoactive bowel sounds  Extremities: no edema  Neurological: Alert and oriented X 3, moves all extremities.      Meds:   Medications were reviewed:  Scheduled Meds:  Current Facility-Administered Medications   Medication Dose Route Frequency   . enoxaparin  40 mg Subcutaneous Daily   . fluticasone furoate-vilanterol  1 puff Inhalation QAM   . insulin glargine  15 Units Subcutaneous Q12H   . insulin lispro  1-3 Units Subcutaneous QHS   . insulin lispro  1-5 Units Subcutaneous TID AC   . insulin lispro  5 Units Subcutaneous TID MEALS     Continuous Infusions:  PRN Meds:.albuterol, Nursing communication: Adult Hypoglycemia Treatment Algorithm **AND** dextrose **AND** dextrose **AND** glucagon (rDNA)  Labs/Radiology:   Imaging personally reviewed, including: all available    Xr Chest Ap Portable    Result Date: 07/07/2018   No acute process seen. Charlene Brooke, MD 07/07/2018 10:27 PM    Recent Labs     07/08/18  0806 07/08/18  0658 07/08/18  0604 07/08/18  0503 07/08/18  0408 07/08/18  0305   POCT - Glucose Whole blood 165* 136* 108* 112* 155* 169*     Recent Labs   Lab 07/08/18  0432 07/08/18  0004 07/07/18  1924 07/03/18  1735   Sodium 133* 137 135* 138   Potassium 3.9 4.4 5.4* 4.0   Chloride 102 102 93* 104   BUN 9 11 16  3*   Creatinine 1.1 1.2 1.6* 0.9   EGFR >60.0 >60.0 >60.0 >60.0   Glucose 144* 177* 408* 105*   Calcium 8.5 8.9 10.9* 8.9     Recent Labs   Lab 07/08/18  0432 07/07/18  1924 07/02/18  0415 07/01/18  1327   WBC 12.94* 12.61* 6.32 8.11   Hgb 12.4* 16.2 12.9 15.0   Hematocrit 37.5* 51.0* 39.5 47.2   Platelets 312 360* 184 152     Recent Labs   Lab 07/08/18  0432 07/07/18  1924   PT 13.9  --    PT INR 1.1  --    PTT 30 32     Recent Labs   Lab 07/08/18  0004 07/07/18  1924 07/01/18  1327   Alkaline Phosphatase 107* 160* 106   Bilirubin, Total 0.6 0.9 0.7   Bilirubin, Direct  --  0.4  --    ALT 27 36 24   AST (SGOT) 31 43* 45*         Signed by: Arna Medici, MD

## 2018-07-08 NOTE — UM Notes (Signed)
Inpatient Status Review    CC: AMS    23 y.o. male who significant history of type 1 diabetes who presents emergency department the chief complaint of cough, nausea, vomiting, elevated glucose, altered mental status.  Patient reports that he was unable to fill his glucose from a couple of days ago and therefore has not had short acting insulin all day today.  Long-acting insulin was last taken this morning.  He reports he woke up this morning had some stomach pain along with nausea and vomiting.  He has been unable to keep any liquids or foods down today.  Slight cough.  No chest pain.  No shortness of breath.  No diarrhea.  He has no other complaints at this time.  Patient has had a history of DKA in the past.  He states that this feels similar.    VS: Tmax 100.5  P 132  P 20  BP 118/71  SpO2 99%    Labs:   WBC 12.61High       Hematocrit 51.0High  %     Platelets 360High  x10 3/uL     RBC 6.38High        Neutrophils Absolute 10.51High      Protein, UR 30Abnormal      Glucose, UA >=500Abnormal     Ketones UA 80Abnormal      Lipase <4     AST (SGOT) 43High      Alkaline Phosphatase 160High  U/L     Protein, Total 8.5      Globulin 3.7     Glucose 408High       Creatinine 1.6High  mg/dL     Calcium 16.1WRUE  mg/dL     Sodium 454UJW  mEq/L     Potassium 5.4High  mEq/L     Chloride 93Low  mEq/L     CO2 14Low  mEq/L     Anion Gap 28.0     POCT - Glucose Whole blood 53Low Panic      ED Medication Orders (From admission, onward)    Start Ordered     Status Ordering Provider    07/08/18 0900 07/07/18 2102  enoxaparin (LOVENOX) syringe 40 mg  Daily     Route: Subcutaneous  Ordered Dose: 40 mg     Ordered JOHN, REVERLY M    07/08/18 0900 07/07/18 2123  fluticasone furoate-vilanterol (BREO ELLIPTA) 200-25 MCG/INH 1 puff  RT - Every morning     Route: Inhalation  Ordered Dose: 1 puff     Ordered JOHN, REVERLY M    07/07/18 2121 07/07/18 2123  albuterol (PROVENTIL HFA;VENTOLIN HFA) inhaler 2 puff  RT - Every 6 hours as  needed     Route: Inhalation  Ordered Dose: 2 puff     Ordered JOHN, REVERLY M    07/07/18 2056 07/07/18 2055    Continuous     Route: Intravenous     Discontinued JOHN, Rogelia Rohrer    07/07/18 2056 07/07/18 2055  insulin regular (HumuLIN R,NovoLIN R) injection 4.81 Units  Once     Route: Intravenous  Ordered Dose: 0.075 Units/kg     Last MAR action:  Handoff/Dual RN [employer], REVERLY M    07/07/18 2056 07/07/18 2055    Continuous     Route: Intravenous  Ordered Dose: 0.1 Units/kg/hr     Discontinued JOHN, REVERLY M    07/07/18 2046 07/07/18 2045  insulin regular (HumuLIN R,NovoLIN R) injection 5 Units  Once  Route: Intravenous  Ordered Dose: 5 Units     Last MAR action:  Given KINDER, NATHAN M    07/07/18 2046 07/07/18 2045  0.9%  NaCl infusion  Continuous     Route: Intravenous     Last MAR action:  New Bag KINDER, NATHAN M    07/07/18 2045 07/07/18 2055  dextrose (D10W) 10% bolus 125 mL  As needed     Route: Intravenous  Ordered Dose: 125 mL     Ordered JOHN, REVERLY M    07/07/18 2045 07/07/18 2055    As needed     Route: Intravenous  Ordered Dose: 125 mL     Discontinued JOHN, REVERLY M    07/07/18 2025 07/07/18 2024  insulin regular (HumuLIN R,NovoLIN R) 100 Units in sodium chloride 0.9 % 100 mL infusion  Continuous     Route: Intravenous  Ordered Dose: 0.1 Units/kg/hr     Last MAR action:  Stopped Arther Abbott    07/07/18 2005 07/07/18 2004  sodium chloride 0.9 % bolus 1,000 mL  Once     Route: Intravenous  Ordered Dose: 1,000 mL     Last MAR action:  Stopped Arther Abbott    07/07/18 1908 07/07/18 1907    Once     Route: Intravenous  Ordered Dose: 1,000 mL     Discontinued KINDER, NATHAN M    07/07/18 1901 07/07/18 1901  sodium chloride 0.9 % bolus 1,000 mL  Once     Route: Intravenous  Ordered Dose: 1,000 mL     Last MAR action:  Stopped KINDER, NATHAN M     Diagnosis:  Final diagnoses:   DKA, type 1, not at goal       Disposition:          ED Disposition     ED  Disposition Condition Date/Time Comment    Admit  Fri Jul 07, 2018  8:55 PM Admitting Physician: Silva Bandy [[id number]]   Diagnosis: DKA, type 1, not at goal [[id number]]   Estimated Length of Stay: 3 - 5 Days   Tentative Discharge Plan?: Home or Self Care [1]   Patient Class: Inpatient [101]     07/08/2018  23 y.o. male h/o T1DM (age 35), multiple recent admissions for DKA, does not follow any physician on a regular basis, asthma, smoker with insulin regimen of 20 units Lantus BID with NovoLog sliding scale by carb counting, admitted to the ICU in DKA.     Patient reports that he has not been able to fill his insulin due to cost prohibitive issues.    07/08/18:    AG 11, bicarb 20.  Symptoms resolved.  Weaned off of insulin drip, started on Lantus 15 units twice daily, plus lispro 5 units 3 times daily with meals and low-dose lispro sliding scale.  Stable for downgrade out of the ICU.    Vital Signs:  BP 112/69   Pulse (!) 103   Temp 100.5 F (38.1 C) (Oral)   Resp (!) 26   Ht 1.727 m (5\' 8" )   Wt 57.5 kg (126 lb 12.2 oz)   SpO2 100%      Labs:  WBC 12.94 x10 3/uL        Hgb 12.4 g/dL      Hematocrit 32.4       Procalcitonin 0.90     POCT - Glucose Whole blood 155     Glucose 177     CO2 18  Alkaline Phosphatase 107      Anion Gap 17.0     Glucose Whole Blood - POCT [[id number]]  (Abnormal) Collected:  07/08/18 0806      Updated:  07/08/18 0814     POCT - Glucose Whole blood 165 mg/dL     Glucose Whole Blood - POCT [[id number]]  (Abnormal) Collected:  07/08/18 0658     Updated:  07/08/18 0703     POCT - Glucose Whole blood 136 mg/dL     Glucose Whole Blood - POCT [[id number]]  (Abnormal) Collected:  07/08/18 0604     Updated:  07/08/18 0614     POCT - Glucose Whole blood 108 mg/dL     Glucose Whole Blood - POCT [[id number]]  (Abnormal) Collected:  07/08/18 0503     Updated:  07/08/18 0507     POCT - Glucose Whole blood 112       Current Facility-Administered Medications   Medication Dose  Route Frequency   . enoxaparin  40 mg Subcutaneous Daily   . fluticasone furoate-vilanterol  1 puff Inhalation QAM   . [START ON 07/09/2018] insulin glargine  15 Units Subcutaneous QAM   . insulin lispro  1-3 Units Subcutaneous QHS   . insulin lispro  1-5 Units Subcutaneous TID AC   . insulin lispro  5 Units Subcutaneous TID MEALS

## 2018-07-09 DIAGNOSIS — E1010 Type 1 diabetes mellitus with ketoacidosis without coma: Secondary | ICD-10-CM

## 2018-07-09 LAB — CBC AND DIFFERENTIAL
Absolute NRBC: 0 10*3/uL (ref 0.00–0.00)
Basophils Absolute Automated: 0.06 10*3/uL (ref 0.00–0.08)
Basophils Automated: 0.5 %
Eosinophils Absolute Automated: 0.15 10*3/uL (ref 0.00–0.44)
Eosinophils Automated: 1.2 %
Hematocrit: 40.1 % (ref 37.6–49.6)
Hgb: 13.1 g/dL (ref 12.5–17.1)
Immature Granulocytes Absolute: 0.11 10*3/uL — ABNORMAL HIGH (ref 0.00–0.07)
Immature Granulocytes: 0.9 %
Lymphocytes Absolute Automated: 2.43 10*3/uL (ref 0.42–3.22)
Lymphocytes Automated: 19.3 %
MCH: 25.7 pg (ref 25.1–33.5)
MCHC: 32.7 g/dL (ref 31.5–35.8)
MCV: 78.6 fL (ref 78.0–96.0)
MPV: 10.2 fL (ref 8.9–12.5)
Monocytes Absolute Automated: 1.46 10*3/uL — ABNORMAL HIGH (ref 0.21–0.85)
Monocytes: 11.6 %
Neutrophils Absolute: 8.39 10*3/uL — ABNORMAL HIGH (ref 1.10–6.33)
Neutrophils: 66.5 %
Nucleated RBC: 0 /100 WBC (ref 0.0–0.0)
Platelets: 332 10*3/uL (ref 142–346)
RBC: 5.1 10*6/uL (ref 4.20–5.90)
RDW: 12 % (ref 11–15)
WBC: 12.6 10*3/uL — ABNORMAL HIGH (ref 3.10–9.50)

## 2018-07-09 LAB — BASIC METABOLIC PANEL
Anion Gap: 11 (ref 5.0–15.0)
BUN: 4 mg/dL — ABNORMAL LOW (ref 9–28)
CO2: 25 mEq/L (ref 22–29)
Calcium: 8.9 mg/dL (ref 8.5–10.5)
Chloride: 101 mEq/L (ref 100–111)
Creatinine: 0.9 mg/dL (ref 0.7–1.3)
Glucose: 210 mg/dL — ABNORMAL HIGH (ref 70–100)
Potassium: 4.1 mEq/L (ref 3.5–5.1)
Sodium: 137 mEq/L (ref 136–145)

## 2018-07-09 LAB — GLUCOSE WHOLE BLOOD - POCT
Whole Blood Glucose POCT: 243 mg/dL — ABNORMAL HIGH (ref 70–100)
Whole Blood Glucose POCT: 191 mg/dL — ABNORMAL HIGH (ref 70–100)
Whole Blood Glucose POCT: 144 mg/dL — ABNORMAL HIGH (ref 70–100)

## 2018-07-09 LAB — MAGNESIUM: Magnesium: 1.6 mg/dL (ref 1.6–2.6)

## 2018-07-09 LAB — PHOSPHORUS: Phosphorus: 2.9 mg/dL (ref 2.3–4.7)

## 2018-07-09 LAB — GFR: EGFR: >60

## 2018-07-09 MED ORDER — TRAZODONE HCL 50 MG PO TABS
50.00 mg | ORAL_TABLET | Freq: Once | ORAL | Status: AC
Start: 2018-07-09 — End: 2018-07-09
  Administered 2018-07-09: 05:00:00 50 mg via ORAL
  Filled 2018-07-09: qty 1

## 2018-07-09 MED ORDER — INSULIN ASPART 100 UNIT/ML SC SOLN
SUBCUTANEOUS | 3 refills | Status: DC
Start: 2018-07-09 — End: 2018-07-27

## 2018-07-09 NOTE — Plan of Care (Signed)
Problem: Safety  Goal: Patient will be free from injury during hospitalization  Outcome: Adequate for Discharge  Flowsheets (Taken 07/09/2018 1707)  Patient will be free from injury during hospitalization : Assess patient's risk for falls and implement fall prevention plan of care per policy; Hourly rounding  Note:   Bed remains in low position, rails up x3, call bell within reach.     Problem: Safety  Goal: Patient will be free from infection during hospitalization  Outcome: Progressing  Flowsheets (Taken 07/09/2018 1707)  Free from Infection during hospitalization: Monitor lab/diagnostic results; Assess and monitor for signs and symptoms of infection  Note:   V/s stable, afebrile.     Problem: Pain  Goal: Pain at adequate level as identified by patient  Outcome: Completed  Flowsheets (Taken 07/09/2018 1707)  Pain at adequate level as identified by patient: Evaluate patient's satisfaction with pain management progress  Note:   No c/o pain.     Problem: Discharge Barriers  Goal: Patient will be discharged home or other facility with appropriate resources  Outcome: Progressing  Flowsheets (Taken 07/09/2018 1707)  Discharge to home or other facility with appropriate resources: Provide appropriate patient education; Initiate discharge planning  Note:   Anticipating d/c.  Assess for needs.     Problem: Diabetes: Glucose Imbalance  Goal: Blood glucose stable at established goal  Outcome: Progressing  Flowsheets (Taken 07/09/2018 1707)  Blood glucose stable at established goal : Assess for hypoglycemia /hyperglycemia; Ensure adequate hydration; Monitor lab values  Note:   Continue accuchecks, encourage po intake.

## 2018-07-09 NOTE — Progress Notes (Signed)
07/09/18 1558   Discharge Disposition   Patient preference/choice provided? N/A  (PT REFERRED TO TRANSITIONAL CLINIC FOR F/U APPT)   Physical Discharge Disposition Home   Name of Home Health Agency Placement   (N/A)   Name of Hospice Agency   (N/A)   Name of DME Agency   (N/A)   Name of Infusion Company Placement   (N/A)   Receiving facility, unit and room number:   (N/A)   Nursing report phone number:   (N/A)   Facility fax number:   (N/A)   Mode of Transportation Car   Patient/Family/POA notified of transfer plan Yes   Patient agreeable to discharge plan/expected d/c date? Yes   Family/POA agreeable to discharge plan/expected d/c date? Yes   Bedside nurse notified of transport plan? Yes   Special requirements for patient during transport:   (N/A)   CM Interventions   Follow up appointment scheduled? No   Reason no follow up scheduled? Family to schedule;Referred to [facility] D/C clinic   Referral made for home health RN visit? No, Other (comment)   Multidisciplinary rounds/family meeting before d/c? Yes

## 2018-07-09 NOTE — Discharge Summary (Signed)
MEDICINE DISCHARGE SUMMARY    Date Time: 07/09/18 3:28 PM  Patient Name: [name] [name], [name] [name].  Attending Physician: Arna Medici, MD  Primary Care Physician: Marisa Sprinkles, MD    Date of Admission: 07/07/2018  Date of Discharge: 07/09/2018    Discharge Diagnoses:     Principal Diagnosis (Diagnosis after study, that is chiefly responsible for admission):   Active Hospital Problems    Diagnosis POA   . Principal Problem: DKA, type 1, not at goal Yes      Resolved Hospital Problems   No resolved problems to display.   Tobacco use  Recent Influenza       Disposition:    Disposition: home with family    Pending Results, Recommendations & Instructions to providers after discharge:     1. Micro / Labs / Path pending:   Unresulted Labs     Procedure . . . Date/Time    Beta-Hydroxybutyrate [[id number]] Collected:  07/08/18 0004     Updated:  07/08/18 0013        2. Wound Care Instructions: NA  3. Date of completion for antibiotics or other medications: NA  4. Code Status:Full     Procedures/Radiology performed:   Radiology: all results from this admission  Xr Chest 2 Views    Result Date: 07/02/2018   1. Minimal lingular airspace disease which may represent mild focal scarring or pneumonitis 2. Slight blunting of left costophrenic angle which may indicate a trace left pleural effusion or pleural thickening/scarring Laurena Slimmer, MD 07/02/2018 10:33 AM    Xr Chest Ap Portable    Result Date: 07/07/2018   No acute process seen. Charlene Brooke, MD 07/07/2018 10:27 PM    Surgery: all results from this admission  * No surgery found [facility] Course:     Reason for admission/ HPI:  23 y.o. male h/o T1DM (age 23), multiple recent admissions for DKA, does not follow any physician on a regular basis, asthma, smoker .Most recent admission 12/28 - 12/30 for DKA with flu B.    Patient reports that he has not been able to fill his insulin due to cost prohibitive issues.  Has had abdominal discomfort with nausea and vomiting.     Hospital  Course: 23 year old male with known history of diabetes type 1 on insulin, unable to use Humalog as an outpatient due to financial reasons (- only novolog covered by insurance) and presented with diabetic ketoacidosis with altered mental status.    -Diabetes type 1 uncontrolled and DKA  Patient treated with insulin drip per protocol in the ICU and after stabilization was transferred out.  He has been resumed on his Lantus and actually due to some borderline blood sugar was initially started on 15 units but at this time based on the stability of his blood sugars can go back to her usual home dose of 20 units twice daily also continue with short-acting insulin with meals based on carb counting.  Patient indicates that only NovoLog is covered by his insurance plan and was provided prescription for same.  Also case management involved with discharge planning   He has not started following with any of the transitional clinics or other local clinics that he has been recommended but shares that he plans to schedule visit with PCP in the area, Dr. Everardo Beals soon, transitional care information was again provided.    -Hyperkalemia and acute kidney failure related to DKA resolved    Discharge condition: stable  Discharge Day Exam:  Today:  BP 110/56   Pulse 96   Temp 99.4 F (37.4 C) (Oral)   Resp 16   Ht 1.768 m (5' 9.6")   Wt 60.6 kg (133 lb 9.6 oz)   SpO2 99%   BMI 19.39 kg/m   Ranges for the last 24 hours:  Temp:  [98.4 F (36.9 C)-99.4 F (37.4 C)] 99.4 F (37.4 C)  Heart Rate:  [82-102] 96  Resp Rate:  [16-18] 16  BP: (104-134)/(56-84) 110/56  Body mass index is 19.39 kg/m.    No intake or output data in the 24 hours ending 07/09/18 1528 General: awake, alert WD male,in no acute distress  Cardiovascular: regular rate and rhythm, no murmurs, rubs or gallops  Lungs: clear to auscultation bilaterally, no additional sounds  Abdomen: soft, non-tender, non-distended; normoactive bowel sounds  Extremities: no  edema  Neurological: Alert and oriented X 3, moves all extremities.        Wounds/decutibus ulcers/stage:    Consultations:   Treatment Team:   Attending Provider: Arna Medici, MD  Consulting Physician: Arna Medici, MD  Recent Labs - Last 2:       Recent Labs   Lab 07/09/18  0517 07/08/18  0432 07/07/18  1924   WBC 12.60* 12.94* 12.61*   Hgb 13.1 12.4* 16.2   Hematocrit 40.1 37.5* 51.0*   Platelets 332 312 360*      Recent Labs   Lab 07/08/18  0432 07/07/18  1924   PT 13.9  --    PT INR 1.1  --    PTT 30 32      Recent Labs   Lab 07/09/18  0517 07/08/18  1214 07/08/18  0432 07/08/18  0004 07/07/18  1924 07/03/18  1735 07/03/18  0600   Sodium 137 134* 133* 137 135* 138 136   Potassium 4.1 4.4 3.9 4.4 5.4* 4.0 3.2*   Chloride 101 101 102 102 93* 104 105   CO2 25 19* 20* 18* 14* 25 23   BUN 4* 8* 9 11 16  3* 4*   Creatinine 0.9 1.1 1.1 1.2 1.6* 0.9 0.8   EGFR >60.0 >60.0 >60.0 >60.0 >60.0 >60.0 >60.0   Glucose 210* 273* 144* 177* 408* 105* 47*   Calcium 8.9 9.0 8.5 8.9 10.9* 8.9 8.0*     Recent Labs   Lab 07/08/18  0004 07/07/18  1924   Alkaline Phosphatase 107* 160*   Bilirubin, Total 0.6 0.9   Bilirubin, Direct  --  0.4   Protein, Total 6.2 8.5*   Albumin 3.5 4.8   ALT 27 36   AST (SGOT) 31 43*            Invalid input(s): FREET4         Microbiology Results     Procedure Component Value Units Date/Time    Rapid Influenza A/B Antigens [[id number]] Collected:  07/07/18 1924    Specimen:  Nasopharyngeal from Nasal Aspirate Updated:  07/07/18 1949    Narrative:       ORDER#: [id number]                                    ORDERED BY: KINDER, NATHAN  SOURCE: Nasal Aspirate                               COLLECTED:  07/07/18  19:24  ANTIBIOTICS AT COLL.:                                RECEIVED :  07/07/18 19:32  Influenza Rapid Antigen A&B                FINAL       07/07/18 19:49  07/07/18   Negative for Influenza A and B             Reference Range: Negative            Discharge Instructions & Follow Up Plan for Patient:    Discharge Diet: Diet consistent carbohydrate  Supervise For Meals Frequency: All meals  Activity/Weight Bearing Status: as tolerated   Patient was instructed to follow up with:     Follow-up Information     [facility] Call on 07/10/2018.    Why:  Pls call  to schedule f/u appt after Discharge from hosspital  Contact information:  [address]  [address]  [place] [address]  [phone]           Pcp, None, MD .                 Complete instructions and follow up are in the patient's After Visit Summary    Minutes spent coordinating discharge and reviewing discharge plan:33 minutes    Discharge Medications:        Medication List      START taking these medications    insulin aspart 100 UNIT/ML injection  Commonly known as:  NovoLOG  3 times daily per carb counting protocol        CONTINUE taking these medications    ACCU-CHEK FASTCLIX LANCETS Misc  Check blood sugar 7 times daily, as directed by MD.     albuterol 108 (90 Base) MCG/ACT inhaler  Commonly known as:  PROVENTIL HFA;VENTOLIN HFA  Inhale 2 puffs into the lungs every 6 (six) hours as needed for Wheezing or Shortness of Breath     fluticasone furoate-vilanterol 200-25 MCG/INH Aepb  Commonly known as:  BREO ELLIPTA  Inhale 1 puff into the lungs daily     glucagon 1 MG injection  Commonly known as:  GLUCAGON EMERGENCY  Inject 1 mg into the skin once as needed (hypoglycemia less than 50)     glucose blood test strip  Commonly known as:  ACCU-CHEK AVIVA PLUS  Check blood sugar 7 times daily, as directed by MD.     insulin glargine 100 UNIT/ML injection  Commonly known as:  LANTUS  Inject 20 Units into the skin every 12 (twelve) hours        STOP taking these medications    insulin lispro 100 UNIT/ML injection  Commonly known as:  HumaLOG           Where to Get Your Medications      You can get these medications from any pharmacy    Bring a paper prescription for each of these medications   insulin  aspart 100 UNIT/ML injection         Immunizations provided:   There is no immunization history on file for this patient.    This note was generated by the Epic EMR system/ Dragon speech recognition and may contain inherent errors or omissions not intended by the user. Grammatical errors, random word insertions, deletions and pronoun errors  are occasional consequences  of this technology due to software limitations. Not all errors are caught or corrected. If there are questions or concerns about the content of this note or information contained within the body of this dictation they should be addressed directly with the author for clarification.    Signed by: Arna Medici, MD  [facility]  Department of Medicine  CC: Pcp, None, MD

## 2018-07-09 NOTE — Progress Notes (Signed)
Pt had door closed over night RN observed pt with intermittent sleep. Pt informed RN and CT that he has not slept all night . MD notified trazodone 50mg  ordered x1.

## 2018-07-09 NOTE — Progress Notes (Signed)
Writer informed that pt is insured  And [employer] coupon provided to him, bpt stated that he is aware that he has insurance, but his insurance does not cover humalog, but they will pay for Novolog.    Pt's does not have a PCP, he was referred to [facility].    Per MD, pt will be [place] today and pt was made aware.

## 2018-07-09 NOTE — Plan of Care (Signed)
Problem: Safety  Goal: Patient will be free from injury during hospitalization  Flowsheets (Taken 07/09/2018 0210)  Patient will be free from injury during hospitalization : Assess patient's risk for falls and implement fall prevention plan of care per policy; Provide and maintain safe environment; Ensure appropriate safety devices are available at the bedside; Include patient/ family/ care giver in decisions related to safety; Hourly rounding  Note:   Pt is encouraged to use call light for assistance.      Problem: Pain  Goal: Pain at adequate level as identified by patient  Flowsheets (Taken 07/09/2018 0210)  Pain at adequate level as identified by patient: Identify patient comfort function goal; Evaluate if patient comfort function goal is met; Evaluate patient's satisfaction with pain management progress  Note:   Pt denies any pain.     Problem: Discharge Barriers  Goal: Patient will be discharged home or other facility with appropriate resources  Flowsheets (Taken 07/09/2018 0210)  Discharge to home or other facility with appropriate resources: Provide appropriate patient education; Provide information on available health resources     Problem: Nutrition  Goal: Nutritional intake is adequate  Flowsheets (Taken 07/09/2018 0210)  Nutritional intake is adequate: Consult/collaborate with Clinical Nutritionist; Include patient/patient care companion in decisions related to nutrition  Note:   Patient's was encouraged to drink snack was given at bed time.     Problem: Diabetes: Glucose Imbalance  Goal: Blood glucose stable at established goal  Flowsheets (Taken 07/09/2018 0210)  Blood glucose stable at established goal : Assess for hypoglycemia /hyperglycemia; Monitor/assess vital signs; Coordinate medication administration with meals, as indicated; Ensure appropriate diet and assess tolerance  Note:   BG was 299  1 unit of insulin given by sliding scale.

## 2018-07-09 NOTE — Discharge Instr - AVS First Page (Signed)
Reason for your Hospital Admission:  DKA      Instructions for after your discharge:  Monitor blood sugars before meals and before bedtime  Diabetic diet

## 2018-07-09 NOTE — Progress Notes (Signed)
Pt d/c home with friend via wheelchair.  D/c instructions given per d/c summary.  Insulin instructions given to pt by dr Delsa Grana, pt verbalized understanding of instructions.  RN again made sure pt understood insulin instructions.  Pt verbalized that he understood.  Prescription given to pt for insulin.  IVs d/c, site without redness,edema and catheter intact.

## 2018-07-10 LAB — BETA-HYDROXYBUTYRATE: Beta-Hydroxybutyrate: 4.3 mmol/L — ABNORMAL HIGH (ref <0.4)

## 2018-07-27 ENCOUNTER — Emergency Department
Admission: EM | Admit: 2018-07-27 | Discharge: 2018-07-27 | Disposition: A | Discharge status: Home / Self Care | Payer: Medicaid HMO | Attending: Emergency Medicine | Admitting: Emergency Medicine

## 2018-07-27 DIAGNOSIS — F17210 Nicotine dependence, cigarettes, uncomplicated: Secondary | ICD-10-CM | POA: Insufficient documentation

## 2018-07-27 DIAGNOSIS — Z794 Long term (current) use of insulin: Secondary | ICD-10-CM | POA: Insufficient documentation

## 2018-07-27 DIAGNOSIS — E1065 Type 1 diabetes mellitus with hyperglycemia: Secondary | ICD-10-CM | POA: Insufficient documentation

## 2018-07-27 DIAGNOSIS — R739 Hyperglycemia, unspecified: Secondary | ICD-10-CM

## 2018-07-27 LAB — CBC AND DIFFERENTIAL
Absolute NRBC: 0 10*3/uL (ref 0.00–0.00)
Basophils Absolute Automated: 0.06 10*3/uL (ref 0.00–0.08)
Basophils Automated: 1.2 %
Eosinophils Absolute Automated: 0.08 10*3/uL (ref 0.00–0.44)
Eosinophils Automated: 1.6 %
Hematocrit: 49.2 % (ref 37.6–49.6)
Hgb: 15.4 g/dL (ref 12.5–17.1)
Immature Granulocytes Absolute: 0.02 10*3/uL (ref 0.00–0.07)
Immature Granulocytes: 0.4 %
Lymphocytes Absolute Automated: 1.62 10*3/uL (ref 0.42–3.22)
Lymphocytes Automated: 32.7 %
MCH: 25.8 pg (ref 25.1–33.5)
MCHC: 31.3 g/dL — ABNORMAL LOW (ref 31.5–35.8)
MCV: 82.4 fL (ref 78.0–96.0)
MPV: 11.5 fL (ref 8.9–12.5)
Monocytes Absolute Automated: 0.36 10*3/uL (ref 0.21–0.85)
Monocytes: 7.3 %
Neutrophils Absolute: 2.82 10*3/uL (ref 1.10–6.33)
Neutrophils: 56.8 %
Nucleated RBC: 0 /100 WBC (ref 0.0–0.0)
Platelets: 253 10*3/uL (ref 142–346)
RBC: 5.97 10*6/uL — ABNORMAL HIGH (ref 4.20–5.90)
RDW: 13 % (ref 11–15)
WBC: 4.96 10*3/uL (ref 3.10–9.50)

## 2018-07-27 LAB — URINALYSIS REFLEX TO MICROSCOPIC EXAM - REFLEX TO CULTURE
Bilirubin, UA: NEGATIVE
Blood, UA: NEGATIVE
Glucose, UA: >=500 — AB
Ketones UA: 20 — AB
Leukocyte Esterase, UA: NEGATIVE
Nitrite, UA: NEGATIVE
Protein, UR: NEGATIVE
Specific Gravity UA: 1.032 (ref 1.001–1.035)
Urine pH: 6 (ref 5.0–8.0)
Urobilinogen, UA: NEGATIVE mg/dL (ref 0.2–2.0)

## 2018-07-27 LAB — COMPREHENSIVE METABOLIC PANEL
ALT: 18 U/L (ref 0–55)
AST (SGOT): 21 U/L (ref 5–34)
Albumin/Globulin Ratio: 1.4 (ref 0.9–2.2)
Albumin: 3.8 g/dL (ref 3.5–5.0)
Alkaline Phosphatase: 107 U/L — ABNORMAL HIGH (ref 38–106)
Anion Gap: 14 (ref 5.0–15.0)
BUN: 13 mg/dL (ref 9–28)
Bilirubin, Total: 0.9 mg/dL (ref 0.2–1.2)
CO2: 22 mEq/L (ref 22–29)
Calcium: 9.1 mg/dL (ref 8.5–10.5)
Chloride: 101 mEq/L (ref 100–111)
Creatinine: 1.2 mg/dL (ref 0.7–1.3)
Globulin: 2.8 g/dL (ref 2.0–3.6)
Glucose: 457 mg/dL — ABNORMAL HIGH (ref 70–100)
Potassium: 4.6 mEq/L (ref 3.5–5.1)
Protein, Total: 6.6 g/dL (ref 6.0–8.3)
Sodium: 137 mEq/L (ref 136–145)

## 2018-07-27 LAB — BLOOD GAS, VENOUS
Base Excess, Ven: -2.8 mEq/L
HCO3, Ven: 22.9 mEq/L
O2 Sat, Venous: 87 %
Temperature: 98.6
Venous Total CO2: 24.3 mEq/L
pCO2, Venous: 45.1 mmHg
pH, Ven: 7.326
pO2, Venous: 60.1 mmHg

## 2018-07-27 LAB — GLUCOSE WHOLE BLOOD - POCT
Whole Blood Glucose POCT: 572 mg/dL (ref 70–100)
Whole Blood Glucose POCT: 351 mg/dL — ABNORMAL HIGH (ref 70–100)
Whole Blood Glucose POCT: 315 mg/dL — ABNORMAL HIGH (ref 70–100)
Whole Blood Glucose POCT: 308 mg/dL — ABNORMAL HIGH (ref 70–100)

## 2018-07-27 LAB — GFR: EGFR: >60

## 2018-07-27 MED ORDER — SODIUM CHLORIDE 0.9 % IV BOLUS
1000.00 mL | Freq: Once | INTRAVENOUS | Status: DC
Start: 2018-07-27 — End: 2018-07-27

## 2018-07-27 MED ORDER — INSULIN ASPART 100 UNIT/ML SC SOLN
SUBCUTANEOUS | 3 refills | Status: AC
Start: 2018-07-27 — End: ?

## 2018-07-27 MED ORDER — SODIUM CHLORIDE 0.9 % IV BOLUS
1000.00 mL | Freq: Once | INTRAVENOUS | Status: AC
Start: 2018-07-27 — End: 2018-07-27
  Administered 2018-07-27: 14:00:00 1000 mL via INTRAVENOUS

## 2018-07-27 MED ORDER — SODIUM CHLORIDE 0.9 % IV BOLUS
1000.00 mL | Freq: Once | INTRAVENOUS | Status: AC
Start: 2018-07-27 — End: 2018-07-27
  Administered 2018-07-27: 13:00:00 1000 mL via INTRAVENOUS

## 2018-07-27 MED ORDER — INSULIN REGULAR HUMAN 100 UNIT/ML IJ SOLN
5.00 [IU] | Freq: Once | INTRAMUSCULAR | Status: AC
Start: 2018-07-27 — End: 2018-07-27
  Administered 2018-07-27: 14:00:00 5 [IU] via INTRAVENOUS
  Filled 2018-07-27: qty 15

## 2018-07-27 MED ORDER — INSULIN GLARGINE 100 UNIT/ML SC SOLN
30.00 [IU] | Freq: Two times a day (BID) | SUBCUTANEOUS | 0 refills | Status: AC
Start: 2018-07-27 — End: ?

## 2018-07-27 NOTE — EDIE (Signed)
COLLECTIVE?NOTIFICATION?07/27/2018 13:04?[name], [name]?MRN: [id number]    [place] [facility]'s patient encounter information:   MRN:[id number]  Account [id number]  Billing Account [id number]      Criteria Met      5 ED Visits in 12 Months    Security and Safety  No recent Security Events currently on file    ED Care Guidelines  There are currently no ED Care Guidelines for this patient. Please check your facility's medical records system.        Prescription Monitoring Program  PDMP query found no report.  Narx Score not available at this time.      E.D. Visit Count (12 mo.)  Facility Visits   [place] [facility] 6   [facility] 2   Total 8   Note: Visits indicate total known visits.      Recent Emergency Department Visit Summary  Date Facility [facility] Type Diagnoses or Chief Complaint   Jul 27, 2018 [place] [facility] [place] [facility]      medic-      Jul 07, 2018 [place] [facility] [place] [facility] [place] [facility]      medic-      medic- hyperglycemia      Cough      Altered Mental Status      1 Type 1 diabetes mellitus with ketoacidosis without coma      Jul 01, 2018 [place] [facility] [place] [facility]      Hyperglycemia      Hyperkalemia      Hypo-osmolality and hyponatremia      Disorder of kidney and ureter, unspecified      Oth diabetes mellitus with ketoacidosis without coma      Flu due to unidentified influenza virus w oth resp manifest      May 10, 2018 [place] [facility] [place] [facility] [place] [facility]      medic-      Laceration      Laceration w/o fb of l idx fngr w/o damage to nail, init      Unsp open wound of unsp finger w/o damage to nail, init      Lacerat extn musc/fasc/tend unsp finger at forarm lv, init      May 04, 2018 [place] [facility] [place] [facility] [place] [facility]      nausea, emesis      Nausea      Hyperglycemia      1 Type 1 diabetes mellitus with ketoacidosis without coma      Apr 19, 2018 [place] [facility] [place]  [facility]      med refill      Medication Refill      Hyperglycemia, unspecified      Nov 24, 2017 [facility] [place] [place] [facility]     Sep 06, 2017 [facility] [place] [place] [facility]         Recent Inpatient Visit Summary  Date Facility [facility] Type Diagnoses or Chief Complaint   Jul 07, 2018 [place] [facility] [place] [facility]      1 Type 1 diabetes mellitus with ketoacidosis without coma      Jul 01, 2018 [place] [facility]      Hyperkalemia      Oth diabetes mellitus with ketoacidosis without coma      Flu due to  unidentified influenza virus w oth resp manifest      Hypo-osmolality and hyponatremia      Disorder of kidney and ureter, unspecified      May 04, 2018  [facility] [place]      1 Type 1 diabetes mellitus with ketoacidosis without coma          Care Team  There is not a care team on record at this time.   Collective Portal  This patient has registered at the [facility] Emergency Department   For more information visit: [email or web address]     PLEASE NOTE:     1.   Any care recommendations and other clinical information are provided as guidelines or for historical purposes only, and providers should exercise their own clinical judgment when providing care.    2.   You may only use this information for purposes of treatment, payment or health care operations activities, and subject to the limitations of applicable Collective Policies.    3.   You should consult directly with the organization that provided a care guideline or other clinical history with any questions about additional information or accuracy or completeness of information provided.    ? 2020 [place], [employer] - [email or web address]

## 2018-07-27 NOTE — ED Notes (Signed)
Bed: E16  Expected date:   Expected time:   Means of arrival:   Comments:

## 2018-07-27 NOTE — Discharge Instructions (Signed)
Hyperglycemia     During the visit today, your blood sugar was found to be high.     The medical term for high blood sugar is hyperglycemia. This may be a one-time event, but it could mean you have diabetes. Untreated diabetes can lead to heart problems and kidney problems (including kidney failure). It can also lead to stroke and blindness. It is very important to follow up with your regular doctor to have your blood sugar re-checked.     Tell your regular doctor that your blood sugar was high today. Your doctor will want to recheck your blood. He or she may also want to order other lab tests. If the doctor finds that you have diabetes, you will need medicine and a special diet to control your blood sugar.     YOU SHOULD SEEK MEDICAL ATTENTION IMMEDIATELY, EITHER HERE OR AT THE NEAREST EMERGENCY DEPARTMENT, IF ANY OF THE FOLLOWING OCCURS:  · Confusion or lethargy (very sleepy and hard to wake up).  · Signs of dehydration. Signs include less urination (peeing), dry mouth, extreme fatigue (tiredness), lightheadedness or fainting.  · Constant vomiting (throwing up).  · Fever (temperature higher than 100.4°F / 38°C) or shaking chills.  · Abdominal (belly) pain or vomiting (throwing up).

## 2018-07-27 NOTE — ED Provider Notes (Signed)
Physician/Midlevel provider first contact with patient: 07/27/18 1307         History     Chief Complaint   Patient presents with   . Hyperglycemia     [name] [name] has a history of type 1 DM and presents with hyperglycemia.  He reports that he ran out of his insulin and did not use any last night.  He usually takes lantus 30 U BID and carb counts for his novolog, with an average of 10-12 units per meal.  He reports feeling dizzy today, and had an episode of emesis on his way to the ER.  He denies abdominal pain.  He has no other complaints.      The history is provided by the patient.   Hyperglycemia   Associated symptoms: dizziness, increased thirst, nausea, polyuria and vomiting    Associated symptoms: no abdominal pain, no chest pain, no fever and no shortness of breath             Past Medical History:   Diagnosis Date   . Diabetes mellitus    . Seasonal asthma        History reviewed. No pertinent surgical history.    History reviewed. No pertinent family history.    Social  Social History     Tobacco Use   . Smoking status: Current Some Day Smoker     Types: Cigarettes   . Smokeless tobacco: Never Used   Substance Use Topics   . Alcohol use: Yes     Comment: socially    . Drug use: Yes     Comment: marijuana a couple times a week        .     Allergies   Allergen Reactions   . Cephalosporins    . Gantrisin [Sulfisoxazole]        Home Medications     Med List Status:  In Progress Set By: Sherron Ales, RN at 07/27/2018  1:15 PM                ACCU-CHEK FASTCLIX LANCETS Misc     Check blood sugar 7 times daily, as directed by MD.     albuterol (PROVENTIL HFA;VENTOLIN HFA) 108 (90 Base) MCG/ACT inhaler     Inhale 2 puffs into the lungs every 6 (six) hours as needed for Wheezing or Shortness of Breath     fluticasone furoate-vilanterol (BREO ELLIPTA) 200-25 MCG/INH Aerosol Powder, Breath Activtivatede     Inhale 1 puff into the lungs daily     glucagon (GLUCAGON EMERGENCY) 1 MG injection     Inject 1 mg into  the skin once as needed (hypoglycemia less than 50)     glucose blood (ACCU-CHEK AVIVA PLUS) test strip     Check blood sugar 7 times daily, as directed by MD.     insulin aspart (NOVOLOG) 100 UNIT/ML injection     3 times daily per carb counting protocol     insulin glargine (LANTUS) 100 UNIT/ML injection     Inject 30 Units into the skin every 12 (twelve) hours                               Review of Systems   Constitutional: Negative for chills and fever.   Respiratory: Negative for shortness of breath.    Cardiovascular: Negative for chest pain and leg swelling.   Gastrointestinal: Positive for nausea and vomiting. Negative  for abdominal pain.   Endocrine: Positive for polydipsia and polyuria.   Musculoskeletal: Negative for back pain.   Allergic/Immunologic: Negative for immunocompromised state.   Neurological: Positive for dizziness and light-headedness. Negative for numbness and headaches.   All other systems reviewed and are negative.      Physical Exam    BP: 124/74, Heart Rate: 98, Temp: 98.5 F (36.9 C), Resp Rate: 16, SpO2: 100 %, Weight: 58.9 kg    Physical Exam  Vitals signs and nursing note reviewed.   Constitutional:       Appearance: He is well-developed.   HENT:      Head: Normocephalic and atraumatic.      Mouth/Throat:      Mouth: Mucous membranes are dry.   Neck:      Musculoskeletal: Normal range of motion and neck supple.      Vascular: No JVD.   Cardiovascular:      Rate and Rhythm: Normal rate and regular rhythm.      Heart sounds: No murmur.   Pulmonary:      Effort: Pulmonary effort is normal. No respiratory distress.      Breath sounds: Normal breath sounds. No wheezing or rales.   Abdominal:      General: Bowel sounds are normal. There is no distension.      Palpations: Abdomen is soft.      Tenderness: There is no abdominal tenderness.   Skin:     General: Skin is warm and dry.   Neurological:      Mental Status: He is alert and oriented to person, place, and time.      Cranial Nerves:  No cranial nerve deficit.           MDM and ED Course     ED Medication Orders (From admission, onward)    Start Ordered     Status Ordering Provider    07/27/18 1330 07/27/18 1329    Once     Route: Intravenous  Ordered Dose: 1,000 mL     Discontinued Modene Andy O    07/27/18 1330 07/27/18 1329  sodium chloride 0.9 % bolus 1,000 mL  Once     Route: Intravenous  Ordered Dose: 1,000 mL     Last MAR action:  Stopped Murriel Holwerda O    07/27/18 1330 07/27/18 1329  insulin regular (HumuLIN R,NovoLIN R) injection 5 Units  Once     Route: Intravenous  Ordered Dose: 5 Units     Last MAR action:  Given Bruk Tumolo O    07/27/18 1316 07/27/18 1315  sodium chloride 0.9 % bolus 1,000 mL  Once     Route: Intravenous  Ordered Dose: 1,000 mL     Last MAR action:  Stopped Shasha Buchbinder O             MDM  Number of Diagnoses or Management Options  Hyperglycemia:   Diagnosis management comments: Hyperglycemia.  Give IVF and 1 U/kg of IV insulin.  CMP and venous blood gas to rule out DKA.    Hyperglycemia, no DKA.  Refill insulin Rx.      Results     Procedure Component Value Units Date/Time    Glucose Whole Blood - POCT [[id number]]  (Abnormal) Collected:  07/27/18 1647     Updated:  07/27/18 1650     POCT - Glucose Whole blood 308 mg/dL     Glucose Whole Blood - POCT [[id number]]  (Abnormal) Collected:  07/27/18 1544  Updated:  07/27/18 1547     POCT - Glucose Whole blood 315 mg/dL     UA Reflex to Micro - Reflex to Culture [[id number]]  (Abnormal) Collected:  07/27/18 1450     Updated:  07/27/18 1501     Urine Type Urine, Clean Ca     Color, UA Straw     Clarity, UA Clear     Specific Gravity UA 1.032     Urine pH 6.0     Leukocyte Esterase, UA Negative     Nitrite, UA Negative     Protein, UR Negative     Glucose, UA >=500     Ketones UA 20     Urobilinogen, UA Negative mg/dL      Bilirubin, UA Negative     Blood, UA Negative    Narrative:       Replace urinary catheter prior to obtaining the urine  culture  if it has been in place for greater than or equal to 14  days:->N/A No Foley  Indications for U/A Reflex to Micro - Reflex to  Culture:->Suprapubic Pain/Tenderness or Dysuria    GFR [[id number]] Collected:  07/27/18 1416     Updated:  07/27/18 1459     EGFR >60.0    Comprehensive metabolic panel [[id number]]  (Abnormal) Collected:  07/27/18 1416     Updated:  07/27/18 1459     Glucose 457 mg/dL      BUN 13 mg/dL      Creatinine 1.2 mg/dL      Sodium 086 mEq/L      Potassium 4.6 mEq/L      Chloride 101 mEq/L      CO2 22 mEq/L      Calcium 9.1 mg/dL      Protein, Total 6.6 g/dL      Albumin 3.8 g/dL      AST (SGOT) 21 U/L      ALT 18 U/L      Alkaline Phosphatase 107 U/L      Bilirubin, Total 0.9 mg/dL      Globulin 2.8 g/dL      Albumin/Globulin Ratio 1.4     Anion Gap 14.0    Glucose Whole Blood - POCT [[id number]]  (Abnormal) Collected:  07/27/18 1443     Updated:  07/27/18 1446     POCT - Glucose Whole blood 351 mg/dL     Blood gas, venous [[id number]] Collected:  07/27/18 1416     Updated:  07/27/18 1439     pH, Ven 7.326     pCO2, Venous 45.1 mmHg      pO2, Venous 60.1 mmHg      HCO3, Ven 22.9 mEq/L      Venous Total CO2 24.3 mEq/L      Base Excess, Ven -2.8 mEq/L      O2 Sat, Venous 87.0 %      Temperature 98.6    CBC and differential [[id number]]  (Abnormal) Collected:  07/27/18 1317    Specimen:  Blood Updated:  07/27/18 1336     WBC 4.96 x10 3/uL      Hgb 15.4 g/dL      Hematocrit 64.4 %      Platelets 253 x10 3/uL      RBC 5.97 x10 6/uL      MCV 82.4 fL      MCH 25.8 pg      MCHC 31.3 g/dL      RDW 13 %  MPV 11.5 fL      Neutrophils 56.8 %      Lymphocytes Automated 32.7 %      Monocytes 7.3 %      Eosinophils Automated 1.6 %      Basophils Automated 1.2 %      Immature Granulocyte 0.4 %      Nucleated RBC 0.0 /100 WBC      Neutrophils Absolute 2.82 x10 3/uL      Abs Lymph Automated 1.62 x10 3/uL      Abs Mono Automated 0.36 x10 3/uL      Abs Eos Automated 0.08 x10 3/uL      Absolute Baso Automated  0.06 x10 3/uL      Absolute Immature Granulocyte 0.02 x10 3/uL      Absolute NRBC 0.00 x10 3/uL     Glucose Whole Blood - POCT [[id number]]  (Abnormal) Collected:  07/27/18 1310     Updated:  07/27/18 1330     POCT - Glucose Whole blood 572 mg/dL         .             Procedures    Clinical Impression & Disposition     Clinical Impression  Final diagnoses:   Hyperglycemia        ED Disposition     ED Disposition Condition Date/Time Comment    Discharge  Thu Jul 27, 2018  4:58 PM [name] [name]. discharge to home/self care.    Condition at disposition: Stable           New Prescriptions    No medications on file                 Genia Hotter, MD  07/27/18 1659

## 2020-12-31 ENCOUNTER — Emergency Department: Admit: 2020-12-31 | Payer: PRIVATE HEALTH INSURANCE

## 2020-12-31 ENCOUNTER — Inpatient Hospital Stay
Admit: 2020-12-31 | Discharge: 2020-12-31 | Disposition: A | Discharge status: Home / Self Care | Payer: PRIVATE HEALTH INSURANCE | Attending: Emergency Medicine

## 2020-12-31 DIAGNOSIS — J4521 Mild intermittent asthma with (acute) exacerbation: Secondary | ICD-10-CM

## 2020-12-31 LAB — COVID-19 WITH INFLUENZA A/B
Influenza A By PCR: NOT DETECTED
Influenza A by PCR: NOT DETECTED
Influenza B By PCR: NOT DETECTED
Influenza B by PCR: NOT DETECTED
SARS-CoV-2 by PCR: NOT DETECTED
SARS-CoV-2: NOT DETECTED

## 2020-12-31 MED ORDER — IPRATROPIUM-ALBUTEROL 2.5 MG-0.5 MG/3 ML NEB SOLUTION
2.5 mg-0.5 mg/3 ml | INHALATION_SOLUTION | RESPIRATORY_TRACT | 0 refills | Status: AC | PRN
Start: 2020-12-31 — End: ?

## 2020-12-31 MED ORDER — ALBUTEROL SULFATE 90 MCG/ACTUATION BREATH ACTIVATED POWDER INHALER
90 mcg/actuation | RESPIRATORY_TRACT | 0 refills | Status: AC | PRN
Start: 2020-12-31 — End: ?

## 2020-12-31 MED ORDER — IPRATROPIUM-ALBUTEROL 2.5 MG-0.5 MG/3 ML NEB SOLUTION
2.5 mg-0.5 mg/3 ml | RESPIRATORY_TRACT | Status: AC
Start: 2020-12-31 — End: 2020-12-31
  Administered 2020-12-31: 14:00:00 via RESPIRATORY_TRACT

## 2020-12-31 MED ORDER — PREDNISONE 20 MG TAB
20 mg | Freq: Once | ORAL | Status: AC
Start: 2020-12-31 — End: 2020-12-31
  Administered 2020-12-31: 14:00:00 via ORAL

## 2020-12-31 MED FILL — PREDNISONE 20 MG TAB: 20 mg | ORAL | Qty: 3

## 2020-12-31 MED FILL — IPRATROPIUM-ALBUTEROL 2.5 MG-0.5 MG/3 ML NEB SOLUTION: 2.5 mg-0.5 mg/3 ml | RESPIRATORY_TRACT | Qty: 3

## 2020-12-31 NOTE — ED Notes (Signed)
C/o wheezing and chest tightness. Hx asthma. States he does not know where his inhaler is.

## 2020-12-31 NOTE — ED Notes (Signed)
 Emergency Department Nursing Plan of Care       The Nursing Plan of Care is developed from the Nursing assessment and Emergency Department Attending provider initial evaluation.  The plan of care may be reviewed in the "ED Provider note".    The Plan of Care was developed with the following considerations:   Patient / Family readiness to learn indicated ab:czmajopszi understanding  Persons(s) to be included in education: patient  Barriers to Learning/Limitations: No    Signed     Rosina KATHEE Skates, RN    12/31/2020   10:28 AM

## 2020-12-31 NOTE — ED Notes (Signed)
Patient (s)  given copy of dc instructions and  paper script(s) and 2 electronic scripts.  Patient (s)  verbalized understanding of instructions and script (s).  Patient given a current medication reconciliation form and verbalized understanding of their medications.   Patient (s) verbalized understanding of the importance of discussing medications with  his or her physician or clinic they will be following up with.  Patient alert and oriented and in no acute distress.  Patient offered wheelchair from treatment area to hospital entrance, patient declined wheelchair.

## 2020-12-31 NOTE — ED Provider Notes (Signed)
ED Provider Notes by Wyatt Haste, NP at 12/31/20 1015                Author: Wyatt Haste, NP  Service: Emergency Medicine  Author Type: Nurse Practitioner       Filed: 12/31/20 1243  Date of Service: 12/31/20 1015  Status: Attested           Editor: Wyatt Haste, NP (Nurse Practitioner)  Cosigner: Costella Hatcher, MD at 12/31/20 1453          Attestation signed by Costella Hatcher, MD at 12/31/20 1453          2:53 PM   I was personally available for consultation in the emergency department.  I have reviewed the chart and agree with the documentation recorded by the Advanced Practice Clinician, including the assessment, treatment plan, and disposition.   [name] Bend, MD                              EMERGENCY DEPARTMENT HISTORY AND PHYSICAL EXAM         Date: 12/31/2020   Patient Name: [name] [name]        History of Presenting Illness          Chief Complaint       Patient presents with        ?  Wheezing           History Provided By: Patient         Additional History (Context): [name] [name]  is a 25 y.o. male  with diabetes who presents with wheezing.  Onset yesterday.  Associated with  cough and chest tightness.  Denies shortness of breath, fever, chills.  Patient states he uses albuterol inhaler and nebulizer treatment at times however he is unable to find his prescriptions due to current travel.  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  Denies exposure to sick contacts.  Reports last hospitalization for asthma attack approximately 2 years ago.  Patient states he has not had an asthma attack in 1 year.  Denies loss of taste, loss of smell, fatigue.      PCP: None        Current Outpatient Medications          Medication  Sig  Dispense  Refill           ?  albuterol-ipratropium (DUO-NEB) 2.5 mg-0.5 mg/3 ml nebu  3 mL by Nebulization route every four (4) hours as needed for Wheezing.  120 Nebule  0     ?  albuterol sulfate (PROAIR RESPICLICK)  90 mcg/actuation breath activated inhaler  Take 2 Puffs by inhalation every four (4) hours as needed for Wheezing.  1 Each  0           ?  insulin glargine (LANTUS) 100 unit/mL injection  28 Units by SubCUTAneous route nightly.  1 Vial  0             Past History        Past Medical History:     Past Medical History:        Diagnosis  Date         ?  Diabetes (HCC)            Type 1           Past Surgical History:  Past Surgical History:         Procedure  Laterality  Date          ?  HX LOBECTOMY              Left lung removal          ?  HX LOBECTOMY               Family History:   History reviewed. No pertinent family history.      Social History:     Social History          Tobacco Use         ?  Smoking status:  Current Every Day Smoker              Packs/day:  0.50         ?  Smokeless tobacco:  Never Used       Substance Use Topics         ?  Alcohol use:  Yes             Comment: "alot".         ?  Drug use:  Yes              Types:  Cocaine, Marijuana           Allergies:     Allergies        Allergen  Reactions         ?  Cephalosporins  Unknown (comments)         ?  Gantrisin  Unknown (comments)                Review of Systems     Review of Systems    Constitutional: Negative for chills and fever.    HENT: Negative for congestion, rhinorrhea, sneezing and sore throat.     Respiratory: Positive for chest tightness and wheezing . Negative for cough and shortness of breath.     Cardiovascular: Negative for chest pain and leg swelling.    Gastrointestinal: Negative for abdominal pain, constipation, diarrhea, nausea and vomiting.    Genitourinary: Negative for dysuria, frequency and urgency.    Musculoskeletal: Negative for back pain, gait problem and neck pain.    Skin: Negative for wound.    Neurological: Negative for dizziness, numbness and headaches.    All other systems reviewed and are negative.           Physical Exam          Vitals:          12/31/20 0935        BP:  120/77     Pulse:  97      Resp:  18     Temp:  98 ??F (36.7 ??C)     SpO2:  95%     Weight:  58.1 kg (128 lb)        Height:  5\' 8"  (1.727 m)        Physical Exam   Vitals and nursing note reviewed.   Constitutional:        General: He is not in acute distress.     Appearance: He is well-developed. He is not ill-appearing.    HENT:       Head: Normocephalic and atraumatic.      Right Ear: Tympanic membrane, ear canal and external ear normal.      Left Ear: Tympanic membrane, ear canal and  external ear normal.      Nose: Nose normal.      Mouth/Throat:      Mouth:  Mucous membranes are moist.      Pharynx: Oropharynx is clear. No oropharyngeal exudate.    Eyes:       Extraocular Movements: Extraocular movements intact.      Conjunctiva/sclera: Conjunctivae normal.      Pupils: Pupils are equal, round, and reactive to light.   Cardiovascular :       Rate and Rhythm: Normal rate and regular rhythm.      Pulses: Normal pulses.      Heart sounds: Normal heart sounds.    Pulmonary:       Effort: Pulmonary effort is normal.      Breath sounds: Wheezing (inspiratoy  and expiratory throughout ) present. No rhonchi.    Chest:       Chest wall: No tenderness.   Abdominal :      General: Bowel sounds are normal. There is no distension.      Palpations: Abdomen is soft.      Tenderness: There is no abdominal tenderness. There is no guarding or rebound.     Musculoskeletal:      Cervical back: Normal range of motion and neck supple.     Lymphadenopathy:       Cervical: No cervical adenopathy.   Skin :      General: Skin is warm and dry.   Neurological :       Mental Status: He is alert and oriented to person, place, and time.      GCS: GCS eye subscore is 4. GCS verbal subscore is  5. GCS motor subscore is 6.      Cranial Nerves: No cranial nerve deficit.   Psychiatric:          Thought Content: Thought content normal.                  Diagnostic Study Results        Labs -         Recent Results (from the past 12 hour(s))     COVID-19 WITH INFLUENZA A/B           Collection Time: 12/31/20 10:40 AM         Result  Value  Ref Range            SARS-CoV-2 by PCR  Not detected  NOTD         Influenza A by PCR  Not detected               Influenza B by PCR  Not detected              Radiologic Studies -      XR CHEST PA LAT       Final Result     1. No acute cardiopulmonary disease radiographically.. Stable chronic changes     left lung base..     2. Thoracolumbar levocurvature slightly exaggerated or increased since the prior     study.     3. Possible mild pectus excavatum. Correlate clinically.                      CT Results   (Last 48 hours)          None                 CXR Results   (  Last 48 hours)                                    12/31/20 1029    XR CHEST PA LAT  Final result            Impression:    1. No acute cardiopulmonary disease radiographically.. Stable chronic changes      left lung base..      2. Thoracolumbar levocurvature slightly exaggerated or increased since the prior      study.      3. Possible mild pectus excavatum. Correlate clinically.                              Narrative:    INDICATION:  wheezing.             EXAM: 2 VIEW CHEST RADIOGRAPH.             COMPARISON: 03/02/2015.             FINDINGS: Frontal and lateral views of the chest show minimal left costophrenic      angle blunting, stable, with minimal left basilar atelectasis or scar, also      stable.. . The heart, mediastinum and pulmonary vasculature are stable.  The      bony thorax is remarkable for thoracolumbar levocurvature, slightly increased      since the prior study, with possible pectus excavatum.                                       Medical Decision Making     I am the first provider for this patient.      I reviewed the vital signs, available nursing notes, past medical history, past surgical history, family history and social history   Records Reviewed: Nursing Notes, Old Medical Records, Previous Radiology  Studies and Previous Laboratory Studies       25 year old  male presenting for wheezing exhibiting wheezes that are inspiratory and expiratory throughout.  Respirations are unlabored and no signs of tachypnea.  95% on room air.  Plan to obtain chest x-ray in addition will give DuoNeb that was previously  ordered as protocol.  Due to excessive travel to different states for his occupation we will obtain COVID test for rule out.      ED Course:      ED Course as of 12/31/20 1242       Wed Dec 31, 2020        1137  Progress Note:    Patient reports feeling better. Mild exp wheeizing in LUL and LLL remaining. CXR and covid test neg. Offered additional neb treatment but pt opt for discharge and rx. Discussed PCP follow up. Will hold on steroid taper due to hx of DM. Pt would like to  try inhaler for first and will follow up if no improvement . [NA]              ED Course User Index   [NA] Shaynah Hund, [place], NP              Disposition:   Discharge    DISCHARGE NOTE:       Pt has been reexamined. Patient has no new complaints, changes, or physical findings.  Care plan  outlined and precautions discussed.  All of pt's questions and concerns were addressed. Patient was instructed and agrees to follow up with PCP, as well as  to return to the ED upon further deterioration. Patient is ready to go home.        Follow-up Information               Follow up With  Specialties  Details  Why  Contact Info              [facility]    Call in 1 week  As needed, If symptoms worsen  [facility]   [address], [address]   [place] [address]   [phone]                  Discharge Medication List as of 12/31/2020 11:41 AM              START taking these medications          Details        albuterol-ipratropium (DUO-NEB) 2.5 mg-0.5 mg/3 ml nebu  3 mL by Nebulization route every four (4) hours as needed for Wheezing., Normal, Disp-120 Nebule, R-0               albuterol sulfate (PROAIR RESPICLICK) 90 mcg/actuation breath  activated inhaler  Take 2 Puffs by inhalation every four (4) hours as needed for Wheezing., Normal, Disp-1 Each, R-0                     CONTINUE these medications which have NOT CHANGED          Details        insulin glargine (LANTUS) 100 unit/mL injection  28 Units by SubCUTAneous route nightly., Print, Disp-1 Vial, R-0                              Diagnosis        Clinical Impression:       1.  Mild intermittent asthma with acute exacerbation

## 2021-03-18 ENCOUNTER — Emergency Department: Admit: 2021-03-18 | Payer: PRIVATE HEALTH INSURANCE

## 2021-03-18 ENCOUNTER — Inpatient Hospital Stay
Admit: 2021-03-18 | Discharge: 2021-03-18 | Disposition: A | Discharge status: Home / Self Care | Payer: PRIVATE HEALTH INSURANCE | Attending: Emergency Medicine

## 2021-03-18 DIAGNOSIS — N432 Other hydrocele: Secondary | ICD-10-CM

## 2021-03-18 LAB — URINALYSIS W/ RFLX MICROSCOPIC
Bilirubin, Urine: NEGATIVE
Bilirubin: NEGATIVE
Blood, Urine: NEGATIVE
Blood: NEGATIVE
Glucose, Ur: >1000 mg/dL — AB
Glucose: >1000 mg/dL — AB
Ketone: NEGATIVE mg/dL
Ketones, Urine: NEGATIVE mg/dL
Leukocyte Esterase, Urine: NEGATIVE
Leukocyte Esterase: NEGATIVE
Nitrite, Urine: NEGATIVE
Nitrites: NEGATIVE
Protein, UA: NEGATIVE mg/dL
Protein: NEGATIVE mg/dL
Specific Gravity, UA: >1.03 NA — ABNORMAL HIGH (ref 1.005–1.030)
Specific gravity: >1.03 — ABNORMAL HIGH (ref 1.005–1.030)
Urobilinogen, UA, POCT: 1 EU/dL (ref 0.2–1.0)
Urobilinogen: 1 EU/dL (ref 0.2–1.0)
pH (UA): 7.5 (ref 5.0–8.0)
pH, UA: 7.5 (ref 5.0–8.0)

## 2021-03-18 LAB — CBC WITH AUTOMATED DIFF
ABS. BASOPHILS: 0.1 10*3/uL (ref 0.0–0.1)
ABS. EOSINOPHILS: 0.1 10*3/uL (ref 0.0–0.4)
ABS. IMM. GRANS.: 0 10*3/uL (ref 0.00–0.04)
ABS. LYMPHOCYTES: 1.8 10*3/uL (ref 0.9–3.6)
ABS. MONOCYTES: 0.9 10*3/uL (ref 0.05–1.2)
ABS. NEUTROPHILS: 9.2 10*3/uL — ABNORMAL HIGH (ref 1.8–8.0)
ABSOLUTE NRBC: 0 10*3/uL (ref 0.00–0.01)
BASOPHILS: 1 % (ref 0–2)
EOSINOPHILS: 1 % (ref 0–5)
HCT: 40.5 % (ref 36.0–48.0)
HGB: 12.8 g/dL — ABNORMAL LOW (ref 13.0–16.0)
IMMATURE GRANULOCYTES: 0 % (ref 0.0–0.5)
LYMPHOCYTES: 15 % — ABNORMAL LOW (ref 21–52)
MCH: 26.2 PG (ref 24.0–34.0)
MCHC: 31.6 g/dL (ref 31.0–37.0)
MCV: 83 FL (ref 78.0–100.0)
MONOCYTES: 8 % (ref 3–10)
MPV: 10.7 FL (ref 9.2–11.8)
NEUTROPHILS: 75 % — ABNORMAL HIGH (ref 40–73)
NRBC: 0 PER 100 WBC
PLATELET: 291 10*3/uL (ref 135–420)
RBC: 4.88 M/uL (ref 4.35–5.65)
RDW: 12.9 % (ref 11.6–14.5)
WBC: 12.2 10*3/uL (ref 4.6–13.2)

## 2021-03-18 LAB — METABOLIC PANEL, COMPREHENSIVE
A-G Ratio: 0.9 (ref 0.8–1.7)
ALT (SGPT): 20 U/L (ref 16–61)
AST (SGOT): 21 U/L (ref 10–38)
Albumin: 2.9 g/dL — ABNORMAL LOW (ref 3.4–5.0)
Alk. phosphatase: 156 U/L — ABNORMAL HIGH (ref 45–117)
Anion gap: 4 mmol/L (ref 3.0–18)
BUN/Creatinine ratio: 8 — ABNORMAL LOW (ref 12–20)
BUN: 9 MG/DL (ref 7.0–18)
Bilirubin, total: 0.4 MG/DL (ref 0.2–1.0)
CO2: 31 mmol/L (ref 21–32)
Calcium: 8.6 MG/DL (ref 8.5–10.1)
Chloride: 98 mmol/L — ABNORMAL LOW (ref 100–111)
Creatinine: 1.06 MG/DL (ref 0.6–1.3)
GFR est AA: >60 mL/min/{1.73_m2} (ref >60)
GFR est non-AA: >60 mL/min/{1.73_m2} (ref >60)
Globulin: 3.4 g/dL (ref 2.0–4.0)
Glucose: 431 mg/dL — CR (ref 74–99)
Potassium: 4.3 mmol/L (ref 3.5–5.5)
Protein, total: 6.3 g/dL — ABNORMAL LOW (ref 6.4–8.2)
Sodium: 133 mmol/L — ABNORMAL LOW (ref 136–145)

## 2021-03-18 LAB — GLUCOSE, POC
Glucose (POC): 400 mg/dL — ABNORMAL HIGH (ref 70–110)
Glucose (POC): 284 mg/dL — ABNORMAL HIGH (ref 70–110)
Glucose, POC: 445 MG/DL — CR (ref 74–106)

## 2021-03-18 LAB — LIPASE
Lipase: 266 U/L (ref 73–393)
Lipase: 266 U/L (ref 73–393)

## 2021-03-18 LAB — CBC WITH AUTO DIFFERENTIAL
Basophils %: 1 % (ref 0–2)
Basophils Absolute: 0.1 10*3/uL (ref 0.0–0.1)
Eosinophils %: 1 % (ref 0–5)
Eosinophils Absolute: 0.1 10*3/uL (ref 0.0–0.4)
Granulocyte Absolute Count: 0 10*3/uL (ref 0.00–0.04)
Hematocrit: 40.5 % (ref 36.0–48.0)
Hemoglobin: 12.8 g/dL — ABNORMAL LOW (ref 13.0–16.0)
Immature Granulocytes: 0 % (ref 0.0–0.5)
Lymphocytes %: 15 % — ABNORMAL LOW (ref 21–52)
Lymphocytes Absolute: 1.8 10*3/uL (ref 0.9–3.6)
MCH: 26.2 PG (ref 24.0–34.0)
MCHC: 31.6 g/dL (ref 31.0–37.0)
MCV: 83 FL (ref 78.0–100.0)
MPV: 10.7 FL (ref 9.2–11.8)
Monocytes %: 8 % (ref 3–10)
Monocytes Absolute: 0.9 10*3/uL (ref 0.05–1.2)
NRBC Absolute: 0 10*3/uL (ref 0.00–0.01)
Neutrophils %: 75 % — ABNORMAL HIGH (ref 40–73)
Neutrophils Absolute: 9.2 10*3/uL — ABNORMAL HIGH (ref 1.8–8.0)
Nucleated RBCs: 0 PER 100 WBC
Platelets: 291 10*3/uL (ref 135–420)
RBC: 4.88 M/uL (ref 4.35–5.65)
RDW: 12.9 % (ref 11.6–14.5)
WBC: 12.2 10*3/uL (ref 4.6–13.2)

## 2021-03-18 LAB — COMPREHENSIVE METABOLIC PANEL
ALT: 20 U/L (ref 16–61)
AST: 21 U/L (ref 10–38)
Albumin/Globulin Ratio: 0.9 (ref 0.8–1.7)
Albumin: 2.9 g/dL — ABNORMAL LOW (ref 3.4–5.0)
Alkaline Phosphatase: 156 U/L — ABNORMAL HIGH (ref 45–117)
Anion Gap: 4 mmol/L (ref 3.0–18)
BUN: 9 MG/DL (ref 7.0–18)
Bun/Cre Ratio: 8 — ABNORMAL LOW (ref 12–20)
CO2: 31 mmol/L (ref 21–32)
Calcium: 8.6 MG/DL (ref 8.5–10.1)
Chloride: 98 mmol/L — ABNORMAL LOW (ref 100–111)
Creatinine: 1.06 MG/DL (ref 0.6–1.3)
EGFR IF NonAfrican American: >60 mL/min/{1.73_m2} (ref >60)
GFR African American: >60 mL/min/{1.73_m2} (ref >60)
Globulin: 3.4 g/dL (ref 2.0–4.0)
Glucose: 431 mg/dL (ref 74–99)
Potassium: 4.3 mmol/L (ref 3.5–5.5)
Sodium: 133 mmol/L — ABNORMAL LOW (ref 136–145)
Total Bilirubin: 0.4 MG/DL (ref 0.2–1.0)
Total Protein: 6.3 g/dL — ABNORMAL LOW (ref 6.4–8.2)

## 2021-03-18 LAB — POCT GLUCOSE
POC Glucose: 400 mg/dL — ABNORMAL HIGH (ref 70–110)
POC Glucose: 284 mg/dL — ABNORMAL HIGH (ref 70–110)

## 2021-03-18 LAB — GLUCOSE, POC, BEDSIDE: Glucose, POC: 445 MG/DL (ref 74–106)

## 2021-03-18 MED ORDER — SODIUM CHLORIDE 0.9% BOLUS IV
0.9 % | Freq: Once | INTRAVENOUS | Status: AC
Start: 2021-03-18 — End: 2021-03-18
  Administered 2021-03-18: 18:00:00 via INTRAVENOUS

## 2021-03-18 MED ORDER — DOXYCYCLINE HYCLATE 100 MG TAB
100 mg | ORAL_TABLET | Freq: Two times a day (BID) | ORAL | 0 refills | Status: AC
Start: 2021-03-18 — End: 2021-03-28

## 2021-03-18 MED ORDER — ONDANSETRON (PF) 4 MG/2 ML INJECTION
4 mg/2 mL | Freq: Once | INTRAMUSCULAR | Status: AC
Start: 2021-03-18 — End: 2021-03-18
  Administered 2021-03-18: 18:00:00 via INTRAVENOUS

## 2021-03-18 MED ORDER — MORPHINE 4 MG/ML INTRAVENOUS SOLUTION
4 mg/mL | Freq: Once | INTRAVENOUS | Status: AC
Start: 2021-03-18 — End: 2021-03-18
  Administered 2021-03-18: 18:00:00 via INTRAVENOUS

## 2021-03-18 MED ORDER — INSULIN REGULAR HUMAN 100 UNIT/ML INJECTION
100 unit/mL | INTRAMUSCULAR | Status: AC
Start: 2021-03-18 — End: 2021-03-18
  Administered 2021-03-18: 20:00:00 via SUBCUTANEOUS

## 2021-03-18 MED ORDER — AZITHROMYCIN 250 MG TAB
250 mg | ORAL | Status: AC
Start: 2021-03-18 — End: 2021-03-18
  Administered 2021-03-18: 23:00:00 via ORAL

## 2021-03-18 MED ORDER — ONDANSETRON (PF) 4 MG/2 ML INJECTION
4 mg/2 mL | Freq: Once | INTRAMUSCULAR | Status: AC
Start: 2021-03-18 — End: 2021-03-18
  Administered 2021-03-18: 23:00:00 via INTRAVENOUS

## 2021-03-18 MED ORDER — MORPHINE 4 MG/ML INTRAVENOUS SOLUTION
4 mg/mL | Freq: Once | INTRAVENOUS | Status: AC
Start: 2021-03-18 — End: 2021-03-18
  Administered 2021-03-18: 21:00:00 via INTRAVENOUS

## 2021-03-18 MED ORDER — HYDROCODONE-ACETAMINOPHEN 5 MG-325 MG TAB
5-325 mg | ORAL_TABLET | Freq: Four times a day (QID) | ORAL | 0 refills | Status: AC | PRN
Start: 2021-03-18 — End: 2021-03-21

## 2021-03-18 MED ORDER — MORPHINE 4 MG/ML INTRAVENOUS SOLUTION
4 mg/mL | Freq: Once | INTRAVENOUS | Status: AC
Start: 2021-03-18 — End: 2021-03-18
  Administered 2021-03-18: 20:00:00 via INTRAVENOUS

## 2021-03-18 MED FILL — SODIUM CHLORIDE 0.9 % IV: INTRAVENOUS | Qty: 1000

## 2021-03-18 MED FILL — ONDANSETRON (PF) 4 MG/2 ML INJECTION: 4 mg/2 mL | INTRAMUSCULAR | Qty: 2

## 2021-03-18 MED FILL — MORPHINE 4 MG/ML INTRAVENOUS SOLUTION: 4 mg/mL | INTRAVENOUS | Qty: 1

## 2021-03-18 MED FILL — AZITHROMYCIN 250 MG TAB: 250 mg | ORAL | Qty: 8

## 2021-03-18 MED FILL — INSULIN REGULAR HUMAN 100 UNIT/ML INJECTION: 100 unit/mL | INTRAMUSCULAR | Qty: 1

## 2021-03-18 NOTE — ED Notes (Signed)
Patient arrives via EMS from work with complaints of right testicle pain and swelling x2 days, penile discharge

## 2021-03-18 NOTE — ED Notes (Signed)
Pt to US.

## 2021-03-18 NOTE — ED Provider Notes (Signed)
EMERGENCY DEPARTMENT HISTORY AND PHYSICAL EXAM    Date: 03/18/2021  Patient Name: [name] [name]    History of Presenting Illness     Chief Complaint   Patient presents with    Testicle Pain       History Provided By: Patient and EMS     History [name]):   1:28 PM  [name] [name] is a 25 y.o. male with a PMHX of DM  who presents to the emergency department (room Q5) by EMS C/O right testicular swelling onset 2 days ago. Associated sxs include grayish-white penile discharge, testicular pain radiating to the abdomen. Pt denies fever, chills, nausea, vomiting, constipation, changes in caliber of his stool or any other sxs or complaints.  Patient describes the penile discharge as a cloudy gray-white discharge its in his underwear.  Patient states that urination and ejaculation do not cause pain.  Pain is worsened with moving or touching the testicle.    Chief Complaint: Right testicular swelling  Onset: 2 days ago  Timing:  Acute  Context: Symptoms started spontaneously, symptoms have rapidly worsened since onset  Location: Right testicle/hemiscrotum radiating to right abdomen  Quality: Sharp  Severity: Moderate  Modifying Factors: Nothing makes it better, movement or touch make it worse.  Associated Symptoms:  Right testicular pain, penile discharge    PCP: None     Past History         Past Medical History:  Past Medical History:   Diagnosis Date    Diabetes ([place])     Type 1       Past Surgical History:  Past Surgical History:   Procedure Laterality Date    HX LOBECTOMY      Left lung removal    HX LOBECTOMY         Family History:  History reviewed. No pertinent family history.  Reviewed and non-contributory    Social History:  Social History     Tobacco Use    Smoking status: Former     Packs/day: 0.50     Types: Cigarettes    Smokeless tobacco: Current   Substance Use Topics    Alcohol use: Yes     Comment: here and there    Drug use: Yes     Types: Marijuana       Medications:  Current Outpatient Medications    Medication Sig Dispense Refill    doxycycline (VIBRA-TABS) 100 mg tablet Take 1 Tablet by mouth two (2) times a day for 10 days. 20 Tablet 0    HYDROcodone-acetaminophen (Norco) 5-325 mg per tablet Take 1 Tablet by mouth every six (6) hours as needed for Pain for up to 3 days. Max Daily Amount: 4 Tablets. 12 Tablet 0    albuterol-ipratropium (DUO-NEB) 2.5 mg-0.5 mg/3 ml nebu 3 mL by Nebulization route every four (4) hours as needed for Wheezing. 120 Nebule 0    albuterol sulfate (PROAIR RESPICLICK) 90 mcg/actuation breath activated inhaler Take 2 Puffs by inhalation every four (4) hours as needed for Wheezing. 1 Each 0    insulin glargine (LANTUS) 100 unit/mL injection 28 Units by SubCUTAneous route nightly. 1 Vial 0       Allergies:  Allergies   Allergen Reactions    Cephalosporins Unknown (comments)    Gantrisin Unknown (comments)       Review of Systems      Review of Systems   Constitutional:  Negative for chills and fever.   HENT:  Negative for rhinorrhea and sore  throat.    Eyes:  Negative for pain and visual disturbance.   Respiratory:  Negative for chest tightness, shortness of breath and wheezing.    Cardiovascular:  Negative for chest pain and palpitations.   Gastrointestinal:  Positive for abdominal pain. Negative for constipation, diarrhea, nausea and vomiting.   Genitourinary:  Positive for scrotal swelling and testicular pain.   Musculoskeletal:  Negative for arthralgias and myalgias.   Skin:  Negative for rash and wound.   Neurological:  Negative for speech difficulty, light-headedness and headaches.   Psychiatric/Behavioral:  Negative for agitation and confusion.    All other systems reviewed and are negative.    Physical Exam     Vitals:    03/18/21 1326 03/18/21 1328   BP: 120/71    Pulse: 84    Resp: 14    Temp:  98.2 ??F (36.8 ??C)   SpO2: 100%    Weight: 59 kg (130 lb)    Height: '5\' 7"'  (1.702 m)        Physical Exam  Vitals and nursing note reviewed. Exam conducted with a chaperone present  Gerald Stabs).   Constitutional:       General: He is not in acute distress.     Appearance: Normal appearance. He is normal weight. He is not ill-appearing.   HENT:      Head: Normocephalic and atraumatic.      Nose: Nose normal. No rhinorrhea.      Mouth/Throat:      Mouth: Mucous membranes are moist.      Pharynx: No oropharyngeal exudate or posterior oropharyngeal erythema.   Eyes:      Extraocular Movements: Extraocular movements intact.      Conjunctiva/sclera: Conjunctivae normal.      Pupils: Pupils are equal, round, and reactive to light.   Cardiovascular:      Rate and Rhythm: Normal rate and regular rhythm.      Heart sounds: No murmur heard.    No friction rub. No gallop.   Pulmonary:      Effort: Pulmonary effort is normal. No respiratory distress.      Breath sounds: Normal breath sounds. No wheezing, rhonchi or rales.   Abdominal:      General: Bowel sounds are normal.      Palpations: Abdomen is soft.      Tenderness: There is no abdominal tenderness. There is no guarding or rebound.      Hernia: A hernia is present. Hernia is present in the right inguinal area. There is no hernia in the left inguinal area.   Genitourinary:     Penis: No phimosis or paraphimosis.       Testes: Cremasteric reflex is present.         Right: Tenderness and swelling present. Cremasteric reflex is present.          Left: Mass, tenderness or swelling not present. Cremasteric reflex is present.       Epididymis:      Right: Inflamed and enlarged. Tenderness present.      Left: Normal.      Comments: Patient is very tender to examination.  Tenderness over right inguinal area.  Possible inguinal hernia versus large varicocele versus epididymitis.  Musculoskeletal:         General: No swelling, tenderness or deformity. Normal range of motion.      Cervical back: Normal range of motion and neck supple. No rigidity.   Lymphadenopathy:      Cervical: No cervical  adenopathy.      Lower Body: No right inguinal adenopathy. No left  inguinal adenopathy.   Skin:     General: Skin is warm and dry.      Findings: No rash.   Neurological:      General: No focal deficit present.      Mental Status: He is alert and oriented to person, place, and time.   Psychiatric:         Mood and Affect: Mood normal.         Behavior: Behavior normal.       Diagnostic Study Results     Labs -  Recent Results (from the past 12 hour(s))   URINALYSIS W/ RFLX MICROSCOPIC    Collection Time: 03/18/21  1:30 PM   Result Value Ref Range    Color YELLOW      Appearance CLEAR      Specific gravity >1.030 (H) 1.005 - 1.030    pH (UA) 7.5 5.0 - 8.0      Protein Negative NEG mg/dL    Glucose >1,000 (A) NEG mg/dL    Ketone Negative NEG mg/dL    Bilirubin Negative NEG      Blood Negative NEG      Urobilinogen 1.0 0.2 - 1.0 EU/dL    Nitrites Negative NEG      Leukocyte Esterase Negative NEG     CBC WITH AUTOMATED DIFF    Collection Time: 03/18/21  1:30 PM   Result Value Ref Range    WBC 12.2 4.6 - 13.2 K/uL    RBC 4.88 4.35 - 5.65 M/uL    HGB 12.8 (L) 13.0 - 16.0 g/dL    HCT 40.5 36.0 - 48.0 %    MCV 83.0 78.0 - 100.0 FL    MCH 26.2 24.0 - 34.0 PG    MCHC 31.6 31.0 - 37.0 g/dL    RDW 12.9 11.6 - 14.5 %    PLATELET 291 135 - 420 K/uL    MPV 10.7 9.2 - 11.8 FL    NRBC 0.0 0 PER 100 WBC    ABSOLUTE NRBC 0.00 0.00 - 0.01 K/uL    NEUTROPHILS 75 (H) 40 - 73 %    LYMPHOCYTES 15 (L) 21 - 52 %    MONOCYTES 8 3 - 10 %    EOSINOPHILS 1 0 - 5 %    BASOPHILS 1 0 - 2 %    IMMATURE GRANULOCYTES 0 0.0 - 0.5 %    ABS. NEUTROPHILS 9.2 (H) 1.8 - 8.0 K/UL    ABS. LYMPHOCYTES 1.8 0.9 - 3.6 K/UL    ABS. MONOCYTES 0.9 0.05 - 1.2 K/UL    ABS. EOSINOPHILS 0.1 0.0 - 0.4 K/UL    ABS. BASOPHILS 0.1 0.0 - 0.1 K/UL    ABS. IMM. GRANS. 0.0 0.00 - 0.04 K/UL    DF AUTOMATED     METABOLIC PANEL, COMPREHENSIVE    Collection Time: 03/18/21  1:30 PM   Result Value Ref Range    Sodium 133 (L) 136 - 145 mmol/L    Potassium 4.3 3.5 - 5.5 mmol/L    Chloride 98 (L) 100 - 111 mmol/L    CO2 31 21 - 32 mmol/L    Anion gap 4  3.0 - 18 mmol/L    Glucose 431 (HH) 74 - 99 mg/dL    BUN 9 7.0 - 18 MG/DL    Creatinine 1.06 0.6 - 1.3 MG/DL    BUN/Creatinine ratio 8 (L) 12 -  20      GFR est AA >60 >60 ml/min/1.38m    GFR est non-AA >60 >60 ml/min/1.71m   Calcium 8.6 8.5 - 10.1 MG/DL    Bilirubin, total 0.4 0.2 - 1.0 MG/DL    ALT (SGPT) 20 16 - 61 U/L    AST (SGOT) 21 10 - 38 U/L    Alk. phosphatase 156 (H) 45 - 117 U/L    Protein, total 6.3 (L) 6.4 - 8.2 g/dL    Albumin 2.9 (L) 3.4 - 5.0 g/dL    Globulin 3.4 2.0 - 4.0 g/dL    A-G Ratio 0.9 0.8 - 1.7     LIPASE    Collection Time: 03/18/21  1:30 PM   Result Value Ref Range    Lipase 266 73 - 393 U/L   GLUCOSE, POC    Collection Time: 03/18/21  1:41 PM   Result Value Ref Range    Glucose, POC 445 (HH) 74 - 106 MG/DL    Performed by Mears  [email or web address]    GLUCOSE, POC    Collection Time: 03/18/21  1:59 PM   Result Value Ref Range    Glucose (POC) 400 (H) 70 - 110 mg/dL   GLUCOSE, POC    Collection Time: 03/18/21  4:23 PM   Result Value Ref Range    Glucose (POC) 284 (H) 70 - 110 mg/dL       Radiologic Studies -   CT ABD PELV WO CONT   Final Result      1. No inguinal hernia identified.      2. Wall thickening and edema involving the proximal colon along with small   amount of free fluid in the abdomen and pelvis as well as mild mesenteric   congestion/edema. Findings could relate to proximal colitis or possibly passive   congestion/edema involving the colon from unknown etiology. No bowel   obstruction. The appendix is normal.      3. Pancreas is relatively atrophic with several small low-density   cystic-appearing lesions. Recommend follow-up MRI of the pancreas as an   outpatient for further characterization.               [place]OTUM/TESTICLES W DOPPLER   Final Result         1. Normal blood flow to each testis. No evidence of testicular torsion.    2. Mild heterogeneous appearance to the right epididymis with increased   vascularity suggesting potential changes related to epididymitis.   3.  Small bilateral hydroceles.   4. Bilateral varicoceles.      [place]V NON OBS  LTD   Final Result         1. Complex sonographic structure lateral to the right testis, potentially   reflecting a portion of bowel within the right inguinal canal; however   definitive characterization of this is limited based on the images provided.      > Included, incomplete evaluation of the right epididymis and testis appear   grossly within normal limits.      > Probable trace right hydrocele.        CT Results  (Last 48 hours)                 03/18/21 1722  CT ABD PELV WO CONT Final result    Impression:      1. No inguinal hernia identified.       2. Wall thickening and edema involving the proximal colon along  with small   amount of free fluid in the abdomen and pelvis as well as mild mesenteric   congestion/edema. Findings could relate to proximal colitis or possibly passive   congestion/edema involving the colon from unknown etiology. No bowel   obstruction. The appendix is normal.       3. Pancreas is relatively atrophic with several small low-density   cystic-appearing lesions. Recommend follow-up MRI of the pancreas as an   outpatient for further characterization.                   Narrative:  EXAM: CT of the abdomen and pelvis       CLINICAL INDICATION/HISTORY: Right inguinal pain       COMPARISON: Pelvic ultrasound from the same day       TECHNIQUE: Axial CT imaging of the abdomen and pelvis was performed without   intravenous contrast. Multiplanar reformats were generated. One or more dose   reduction techniques were used on this CT: automated exposure control,   adjustment of the mAs and/or kVp according to patient size, and iterative   reconstruction techniques.  The specific techniques used on this CT exam have   been documented in the patient's electronic medical record. Digital Imaging and   Communications in Medicine (DICOM) format image data are available to   nonaffiliated external healthcare facilities or  entities on a secured, media   free, reciprocally searchable basis with patient authorization for at least a   68-monthperiod after this study.           _______________       FINDINGS:       LOWER CHEST: Unremarkable.       LIVER, BILIARY: Liver is unremarkable. No biliary dilation. Gallbladder is   unremarkable.       PANCREAS: Relatively atrophic with some low-density cystic-appearing lesions   identified measuring up to 9 mm in size.       SPLEEN: Unremarkable.       ADRENALS: Unremarkable.       KIDNEYS/URETERS/BLADDER: Unremarkable.       PELVIC ORGANS: Unremarkable.       LYMPH NODES: No enlarged lymph nodes.       GASTROINTESTINAL TRACT: Mild wall thickening and/or edema involving the proximal   colon. The appendix is normal. No bowel obstruction.       VASCULATURE: Unremarkable.       BONES: No acute or aggressive osseous abnormalities identified.       OTHER: Small amount of free fluid in the abdomen and pelvis. Central mesenteric   congestion and edema. No inguinal hernia identified.       _______________                 CXR Results  (Last 48 hours)      None            Medications given in the ED-  Medications   sodium chloride 0.9 % bolus infusion 1,000 mL (1,000 mL IntraVENous New Bag 03/18/21 1353)   ondansetron (ZOFRAN) injection 4 mg (4 mg IntraVENous Given 03/18/21 1353)   morphine injection 4 mg (4 mg IntraVENous Given 03/18/21 1353)   insulin regular (NOVOLIN R, HUMULIN R) injection 5 Units (5 Units SubCUTAneous Given 03/18/21 1539)   morphine injection 4 mg (4 mg IntraVENous Given 03/18/21 1600)   morphine injection 4 mg (4 mg IntraVENous Given 03/18/21 1712)   azithromycin (ZITHROMAX) tablet 2,000 mg (2,000 mg Oral Given 03/18/21 1854)  ondansetron [facility]) injection 4 mg (4 mg IntraVENous Given 03/18/21 1855)       Procedures     Procedures    ED Course     I Jeanella Craze, MD) am the first provider for this patient.  I reviewed the vital signs, available nursing notes, past medical history, past  surgical history, family history and social history.    Records Reviewed: Nursing Notes    Cardiac Monitor:  Rate: 84 bpm  Rhythm: sinus rhythm    Pulse Oximetry Analysis - 100% on RA    1:28 PM Initial assessment performed. The patients presenting problems have been discussed, and they are in agreement with the care plan formulated and outlined with them.  I have encouraged them to ask questions as they arise throughout their visit.    ED Course as of 03/18/21 2251   Wed Mar 18, 2021   1624 POC lactate was not elevated. [JM]   1610 Discussed with Dr. Argie Ramming.  He recommends dry CT scan based on the patient's pain.  As long as the hernia is not incarcerated he can follow-up as an outpatient. [JM]      ED Course User Index  [JM] [place] Sorn, Rinaldo Ratel, MD         Medical Decision Making     Provider Notes (Medical Decision Making):   DDX: Testicular torsion, inguinal hernia, epididymitis, varicocele, hydrocele, STI, UTI    Discussion:  25 y.o. male with 2 days of right testicular pain and swelling.  Patient also had tenderness over his inguinal area.  Ultrasound showed potential hernia however CT did not bear this out.  As patient was also having penile discharge he was treated for STIs in the ED.  He will continue on doxycycline as an outpatient along with pain medication.  He should follow-up with urology.  Patient understands and agrees with this plan.    Diagnosis and Disposition     DISCHARGE NOTE:  7:15 PM   Alan Chim's  results have been reviewed with him.  He has been counseled regarding his diagnosis, treatment, and plan.  He verbally conveys understanding and agreement of the signs, symptoms, diagnosis, treatment and prognosis and additionally agrees to follow up as discussed.  He also agrees with the care-plan and conveys that all of his questions have been answered.  I have also provided discharge instructions for him that include: educational information regarding their diagnosis and treatment, and list  of reasons why they would want to return to the ED prior to their follow-up appointment, should his condition change. He has been provided with education for proper emergency department utilization.     CLINICAL IMPRESSION:    1. Other hydrocele    2. Varicocele    3. Epididymitis    4. Penile discharge        PLAN:  1. D/C Home  2.   Current Discharge Medication List        START taking these medications    Details   doxycycline (VIBRA-TABS) 100 mg tablet Take 1 Tablet by mouth two (2) times a day for 10 days.  Qty: 20 Tablet, Refills: 0  Start date: 03/18/2021, End date: 03/28/2021      HYDROcodone-acetaminophen (Norco) 5-325 mg per tablet Take 1 Tablet by mouth every six (6) hours as needed for Pain for up to 3 days. Max Daily Amount: 4 Tablets.  Qty: 12 Tablet, Refills: 0  Start date: 03/18/2021, End date: 03/21/2021    Associated Diagnoses:  Other hydrocele; Varicocele; Epididymitis; Penile discharge           3.   Follow-up Information       Follow up With Specialties Details Why Contact Info    Ani, Ifeanyichukwu, MD Urology Schedule an appointment as soon as possible for a visit  As soon as possible, For follow up from Emergency Department visit. [address]  [address]  [place] [address]  [phone]      [facility] EMERGENCY DEPT Emergency Medicine  As needed; If symptoms worsen [address]  [place] [address]  [place]  Schedule an appointment as soon as possible for a visit  As soon as possible, For follow up from Emergency Department visit. [address]  [place]          I Jeanella Craze, MD am the primary clinician of record.    Dragon Disclaimer     Please note that this dictation was completed with Dragon, the Acupuncturist.  Quite often unanticipated grammatical, syntax, homophones, and other interpretive errors are inadvertently transcribed by the computer software.  Please disregard  these errors.  Please excuse any errors that have escaped final proofreading.    Jeanella Craze, MD

## 2021-03-18 NOTE — Progress Notes (Signed)
Attempted to call pt. Unable to leave message.  Pt treated with azithro 2g (cephalosporin allergy), sent with doxy.  Pt will need further treatment with gent IM, or gemifloxacin.

## 2021-03-18 NOTE — ED Notes (Signed)
Type1 dm- no pump bg 445 and 400 . Md aware. 5u insulin ordered

## 2021-03-18 NOTE — ED Notes (Signed)
Awaiting insulin restock from pharmacy

## 2021-03-19 LAB — CHLAMYDIA/NEISSERIA AMPLIFICATION
Chlamydia amplification: POSITIVE — AB
N. gonorrhoeae amplification: POSITIVE — AB

## 2021-03-19 LAB — C.TRACHOMATIS N.GONORRHOEAE DNA
Chlamydia trachomatis, NAA: POSITIVE — AB
Neisseria Gonorrhoeae, NAA: POSITIVE — AB

## 2021-03-22 ENCOUNTER — Inpatient Hospital Stay
Admit: 2021-03-22 | Discharge: 2021-03-22 | Disposition: A | Discharge status: Home / Self Care | Payer: PRIVATE HEALTH INSURANCE | Attending: Emergency Medicine

## 2021-03-22 DIAGNOSIS — A549 Gonococcal infection, unspecified: Secondary | ICD-10-CM

## 2021-03-22 LAB — CBC WITH AUTOMATED DIFF
ABS. BASOPHILS: 0 10*3/uL (ref 0.0–0.1)
ABS. EOSINOPHILS: 0.1 10*3/uL (ref 0.0–0.4)
ABS. IMM. GRANS.: 0 10*3/uL (ref 0.00–0.04)
ABS. LYMPHOCYTES: 1.9 10*3/uL (ref 0.9–3.6)
ABS. MONOCYTES: 0.4 10*3/uL (ref 0.05–1.2)
ABS. NEUTROPHILS: 4.6 10*3/uL (ref 1.8–8.0)
ABSOLUTE NRBC: 0 10*3/uL (ref 0.00–0.01)
BASOPHILS: 1 % (ref 0–2)
EOSINOPHILS: 1 % (ref 0–5)
HCT: 42 % (ref 36.0–48.0)
HGB: 13.3 g/dL (ref 13.0–16.0)
IMMATURE GRANULOCYTES: 0 % (ref 0.0–0.5)
LYMPHOCYTES: 26 % (ref 21–52)
MCH: 26.1 PG (ref 24.0–34.0)
MCHC: 31.7 g/dL (ref 31.0–37.0)
MCV: 82.4 FL (ref 78.0–100.0)
MONOCYTES: 6 % (ref 3–10)
MPV: 10.4 FL (ref 9.2–11.8)
NEUTROPHILS: 66 % (ref 40–73)
NRBC: 0 PER 100 WBC
PLATELET: 331 10*3/uL (ref 135–420)
RBC: 5.1 M/uL (ref 4.35–5.65)
RDW: 12.7 % (ref 11.6–14.5)
WBC: 7.1 10*3/uL (ref 4.6–13.2)

## 2021-03-22 LAB — GLUCOSE, POC
Glucose (POC): 590 mg/dL — CR (ref 70–110)
Glucose (POC): 592 mg/dL — CR (ref 70–110)
Glucose (POC): 190 mg/dL — ABNORMAL HIGH (ref 70–110)

## 2021-03-22 LAB — METABOLIC PANEL, BASIC
Anion gap: 8 mmol/L (ref 3.0–18)
BUN/Creatinine ratio: 7 — ABNORMAL LOW (ref 12–20)
BUN: 8 MG/DL (ref 7.0–18)
CO2: 26 mmol/L (ref 21–32)
Calcium: 9.1 MG/DL (ref 8.5–10.1)
Chloride: 101 mmol/L (ref 100–111)
Creatinine: 1.14 MG/DL (ref 0.6–1.3)
GFR est AA: >60 mL/min/{1.73_m2} (ref >60)
GFR est non-AA: >60 mL/min/{1.73_m2} (ref >60)
Glucose: 583 mg/dL — CR (ref 74–99)
Potassium: 4.2 mmol/L (ref 3.5–5.5)
Sodium: 135 mmol/L — ABNORMAL LOW (ref 136–145)

## 2021-03-22 LAB — CBC WITH AUTO DIFFERENTIAL
Basophils %: 1 % (ref 0–2)
Basophils Absolute: 0 10*3/uL (ref 0.0–0.1)
Eosinophils %: 1 % (ref 0–5)
Eosinophils Absolute: 0.1 10*3/uL (ref 0.0–0.4)
Granulocyte Absolute Count: 0 10*3/uL (ref 0.00–0.04)
Hematocrit: 42 % (ref 36.0–48.0)
Hemoglobin: 13.3 g/dL (ref 13.0–16.0)
Immature Granulocytes: 0 % (ref 0.0–0.5)
Lymphocytes %: 26 % (ref 21–52)
Lymphocytes Absolute: 1.9 10*3/uL (ref 0.9–3.6)
MCH: 26.1 PG (ref 24.0–34.0)
MCHC: 31.7 g/dL (ref 31.0–37.0)
MCV: 82.4 FL (ref 78.0–100.0)
MPV: 10.4 FL (ref 9.2–11.8)
Monocytes %: 6 % (ref 3–10)
Monocytes Absolute: 0.4 10*3/uL (ref 0.05–1.2)
NRBC Absolute: 0 10*3/uL (ref 0.00–0.01)
Neutrophils %: 66 % (ref 40–73)
Neutrophils Absolute: 4.6 10*3/uL (ref 1.8–8.0)
Nucleated RBCs: 0 PER 100 WBC
Platelets: 331 10*3/uL (ref 135–420)
RBC: 5.1 M/uL (ref 4.35–5.65)
RDW: 12.7 % (ref 11.6–14.5)
WBC: 7.1 10*3/uL (ref 4.6–13.2)

## 2021-03-22 LAB — BASIC METABOLIC PANEL
Anion Gap: 8 mmol/L (ref 3.0–18)
BUN: 8 MG/DL (ref 7.0–18)
Bun/Cre Ratio: 7 — ABNORMAL LOW (ref 12–20)
CO2: 26 mmol/L (ref 21–32)
Calcium: 9.1 MG/DL (ref 8.5–10.1)
Chloride: 101 mmol/L (ref 100–111)
Creatinine: 1.14 MG/DL (ref 0.6–1.3)
EGFR IF NonAfrican American: >60 mL/min/{1.73_m2} (ref >60)
GFR African American: >60 mL/min/{1.73_m2} (ref >60)
Glucose: 583 mg/dL (ref 74–99)
Potassium: 4.2 mmol/L (ref 3.5–5.5)
Sodium: 135 mmol/L — ABNORMAL LOW (ref 136–145)

## 2021-03-22 LAB — POCT GLUCOSE
POC Glucose: 590 mg/dL (ref 70–110)
POC Glucose: 592 mg/dL (ref 70–110)
POC Glucose: 190 mg/dL — ABNORMAL HIGH (ref 70–110)

## 2021-03-22 MED ORDER — INSULIN REGULAR HUMAN 100 UNIT/ML INJECTION
100 unit/mL | Freq: Once | INTRAMUSCULAR | Status: AC
Start: 2021-03-22 — End: 2021-03-22
  Administered 2021-03-22: 22:00:00 via INTRAVENOUS

## 2021-03-22 MED ORDER — SODIUM CHLORIDE 0.9% BOLUS IV
0.9 % | Freq: Once | INTRAVENOUS | Status: AC
Start: 2021-03-22 — End: 2021-03-22
  Administered 2021-03-22: 21:00:00 via INTRAVENOUS

## 2021-03-22 MED ORDER — DOXYCYCLINE 100 MG CAP
100 mg | ORAL_CAPSULE | Freq: Two times a day (BID) | ORAL | 0 refills | Status: AC
Start: 2021-03-22 — End: 2021-03-29

## 2021-03-22 MED ORDER — GENTAMICIN 40 MG/ML IJ SOLN
40 mg/mL | INTRAMUSCULAR | Status: DC
Start: 2021-03-22 — End: 2021-03-22

## 2021-03-22 MED ORDER — SODIUM CHLORIDE 0.9 % IV
40 mg/mL | INTRAVENOUS | Status: AC
Start: 2021-03-22 — End: 2021-03-22
  Administered 2021-03-22: 22:00:00 via INTRAVENOUS

## 2021-03-22 MED FILL — SODIUM CHLORIDE 0.9 % IV: INTRAVENOUS | Qty: 100

## 2021-03-22 MED FILL — SODIUM CHLORIDE 0.9 % IV: INTRAVENOUS | Qty: 1000

## 2021-03-22 MED FILL — GENTAMICIN 40 MG/ML IJ SOLN: 40 mg/mL | INTRAMUSCULAR | Qty: 6

## 2021-03-22 MED FILL — INSULIN REGULAR HUMAN 100 UNIT/ML INJECTION: 100 unit/mL | INTRAMUSCULAR | Qty: 1

## 2021-03-22 NOTE — ED Provider Notes (Signed)
ED Provider Notes by Wayne Both, PA-C at 03/22/21 1615                Author: Wayne Both, PA-C  Service: Emergency Medicine  Author Type: Physician Assistant       Filed: 03/22/21 1859  Date of Service: 03/22/21 1615  Status: Attested Addendum          Editor: Reinaldo Raddle (Physician Assistant)       Related Notes: Original Note by Reinaldo Raddle (Physician Assistant) filed at 03/22/21  1630          Cosigner: Jonna Coup, MD at 03/22/21 1923          Attestation signed by Jonna Coup, MD at 03/22/21 1923          MLP ATTESTATION:   I was present in the immediate clinical area during the time of the patient evaluation and available to provide input.  I have reviewed the chart and agree with the documentation recorded by the [facility], including the assessment, treatment plan, and disposition.  Any procedures performed, as noted, were under my direction and / or I was present for the key portions.   Jonna Coup, MD                                                                         EMERGENCY DEPARTMENT HISTORY & PHYSICAL EXAM      [facility] EMERGENCY DEPT   03/22/2021, 4:15 PM      Clinical Impression:      1.  Gonorrhea         2.  Chlamydia      3.  Type 1 diabetes mellitus with other specified complication (HCC)      4.  Epididymitis         5.  Hyperglycemia            Assessment/Differential Diagnosis:      Ddx gonorrhea, chlamydia, testicular pain, type 1 diabetes all considered      ED Course:    Initial assessment performed. The patients presenting problems have been discussed, and they are in agreement with the care plan formulated and outlined with them.  I have encouraged them to ask questions  as they arise throughout their visit.      Patient was seen here on 14 September with scrotal pain and swelling.  Work-up with blood work and imaging revealed epididymitis.  He is allergic to cephalosporins so was given 2 g of azithromycin and discharged with doxycycline,  awaiting STD testing.   Testing did reveal positive gonorrhea and chlamydia.  We attempted to contact patient but his phone is no longer working.  A letter was sent.  Review of recent recommendations states that for gonorrhea and a cephalosporin allergic patient, azithromycin  and gent as recommended.  Patient states that he did pick up his doxycycline but he does not know where it is.  He only took 1 pill.  Patient states he has no new symptoms.  He continues with right-sided scrotal pain and swelling but no worsening  symptoms.  There is been no fever.  Patient states his blood sugars are a little elevated this  morning 240.      Exam with normal vital signs.  He appears healthy and in no distress.  He is walking the halls with no difficulty.  Exam does reveal an enlarged right scrotum with some tenderness to the inferior aspect.  Skin appears healthy with no signs of cellulitis.   No pain with palpation to the testicle.  I did discuss with patient that we have been trying to contact him.  He states his phone is not working, although he is on his phone texting and receiving calls.  He states he is unable to give [place] that phone  number as its not his phone.  I had a long discussion with him regarding his positive gonorrhea and chlamydia.  We will give him IM gent here.  He has already had azithromycin and he asked that I resend the doxycycline as he will now take it.   He knows no sexual activity until retesting.  He knows to contact his sexual partners as they will need to be tested and treated.      BS 590. Pt with no abd pain, vomiting, feeling "normal". States it is often elevated with stress and drinking, he admits to both today. States his calculations states he needs 6.2 SQ reg insulin.    Will check BMP, given 1L NS, and insulin if K+ WNL.   Review of records with elevated sugars as a norm, 400-500.   Will give Gent IV, discussed with pharmacist, no change in dosage.      Pt remained stable in ED. No  complaints.   Long discussion with pt for better glucose control. Pt aware of how to administer insulin per his sliding scale.    Return precautions given         Medical Chart Review:   I have reviewed triage nursing documentation.   Review of old medical records with the following pertinent information:          Disposition:  Home  in good condition.           Chief Complaint       Patient presents with        ?  Testicle Pain        HPI:     The history is provided by patient. No language interpreter used.      [name] [name] is a 25 y.o. male presenting to the Emergency Department with complaints of continued right scrotal pain.  Patient was brought to ED by EMS.  He states that his scrotal pain and swelling has not changed from his visit on 14 September.  He  denies fever, chills, flulike illness, vomiting, abdominal pain or any new concerns.  No significant change in his level of pain or swelling to his right scrotum.  Patient is type I diabetic.  He states his blood sugars have been up and down.  He states  his blood sugar this morning was 240.  He is not felt ill and has no other complaints.  Patient was here with scrotal pain he had extensive work-up including ultrasound, CT.  He was diagnosed with epididymitis, given 2 g of azithromycin and discharged  with Doxy as he has a cephalosporin allergy.  We have been unable to contact patient given his positive test results as the phone numbers listed are not in service.         I have reviewed all PMHX, FMHX and Social Hx as entered into the medical record in  the chart below using the Epic Template.      Review of Systems:   Constitutional:  Neg for fever,  chills, weight changes.   ENT:  Neg for Sore throat, runny nose, or other URI symptoms   Respiratory:  neg for cough, no shortness of breath, or wheezing   Cardiovascular:  No chest pain, chest pressure, palpitations.   GI:  no vomiting, no diarrhea, no abdominal pain.   GU: no dysuria, no frequency, no  urgency. no Flank pain.   MSK no joint pain, no myalgias   Integumentary: no rashes, lesions, or skin irritation.   Neurological: no headaches, dizziness, sensory or motor symptoms.   All other systems reviewed negative except was is positive in ROS and HPI.      Past Medical History:     Past Medical History:        Diagnosis  Date         ?  Diabetes (HCC)            Type 1           Past Surgical History:     Past Surgical History:         Procedure  Laterality  Date          ?  HX LOBECTOMY              Left lung removal          ?  HX LOBECTOMY               Family History:   History reviewed. No pertinent family history.      Social History:     Social History          Tobacco Use         ?  Smoking status:  Former              Packs/day:  0.50         Types:  Cigarettes         ?  Smokeless tobacco:  Current       Substance Use Topics         ?  Alcohol use:  Yes             Comment: here and there         ?  Drug use:  Yes              Types:  Marijuana           Allergies:     Allergies        Allergen  Reactions         ?  Cephalosporins  Unknown (comments)         ?  Gantrisin  Unknown (comments)           Vital Signs:     Vitals:           03/22/21 1553  03/22/21 1851         BP:  130/72  122/67     Pulse:  86  84     Resp:  18  18     Temp:  98.8 ??F (37.1 ??C)       SpO2:  100%  100%     Weight:  59 kg (130 lb)           Height:  5\' 7"  (1.702 m)          Physical  Exam:   Vital Signs Reviewed. Nursing Notes Reviewed.   Constitutional:  Well developed, well nourished patient. Appearance and behavior are age and situation appropriate.   Head: Normocephalic, Atraumatic   Eyes: Conjunctiva clear, lids normal. Sclera anicteric. PERRL.   ENT:  gross hearing normal, throat normal    Lungs: Lungs CTAB. No wheezes, rales or rhonchi. No respiratory distress, tachypnea or accessory muscle use   Cardiac:  RRR without murmur. No peripheral edema   Abdomen: soft, normal BS, nontender, no mass   GU: circumcised penis,  no lesion, no discharge. Scrotum with sig swelling, tenderness inferior aspect with tender lump felt. No testicular pain/mass. Skin appears normal.    Extremities:  no pedal edema   Neuro:  A&O , no obvious neuro deficit with general inspection.   Skin:  Warm, dry, no rash   Back:  No CVAT      Diagnostics:      Labs -         Recent Results (from the past 12 hour(s))     GLUCOSE, POC          Collection Time: 03/22/21  4:37 PM         Result  Value  Ref Range            Glucose (POC)  592 (HH)  70 - 110 mg/dL       GLUCOSE, POC          Collection Time: 03/22/21  4:38 PM         Result  Value  Ref Range            Glucose (POC)  590 (HH)  70 - 110 mg/dL       METABOLIC PANEL, BASIC          Collection Time: 03/22/21  5:00 PM         Result  Value  Ref Range            Sodium  135 (L)  136 - 145 mmol/L       Potassium  4.2  3.5 - 5.5 mmol/L       Chloride  101  100 - 111 mmol/L       CO2  26  21 - 32 mmol/L       Anion gap  8  3.0 - 18 mmol/L       Glucose  583 (HH)  74 - 99 mg/dL       BUN  8  7.0 - 18 MG/DL       Creatinine  2.59  0.6 - 1.3 MG/DL       BUN/Creatinine ratio  7 (L)  12 - 20              GFR est AA  >60  >60 ml/min/1.43m2            GFR est non-AA  >60  >60 ml/min/1.52m2       Calcium  9.1  8.5 - 10.1 MG/DL       CBC WITH AUTOMATED DIFF          Collection Time: 03/22/21  5:00 PM         Result  Value  Ref Range            WBC  7.1  4.6 - 13.2 K/uL       RBC  5.10  4.35 - 5.65 M/uL       HGB  13.3  13.0 - 16.0 g/dL       HCT  95.0  93.2 - 48.0 %       MCV  82.4  78.0 - 100.0 FL       MCH  26.1  24.0 - 34.0 PG       MCHC  31.7  31.0 - 37.0 g/dL       RDW  67.1  24.5 - 14.5 %       PLATELET  331  135 - 420 K/uL       MPV  10.4  9.2 - 11.8 FL       NRBC  0.0  0 PER 100 WBC       ABSOLUTE NRBC  0.00  0.00 - 0.01 K/uL       NEUTROPHILS  66  40 - 73 %       LYMPHOCYTES  26  21 - 52 %       MONOCYTES  6  3 - 10 %       EOSINOPHILS  1  0 - 5 %       BASOPHILS  1  0 - 2 %       IMMATURE GRANULOCYTES  0  0.0 -  0.5 %       ABS. NEUTROPHILS  4.6  1.8 - 8.0 K/UL       ABS. LYMPHOCYTES  1.9  0.9 - 3.6 K/UL       ABS. MONOCYTES  0.4  0.05 - 1.2 K/UL       ABS. EOSINOPHILS  0.1  0.0 - 0.4 K/UL       ABS. BASOPHILS  0.0  0.0 - 0.1 K/UL       ABS. IMM. GRANS.  0.0  0.00 - 0.04 K/UL       DF  AUTOMATED          GLUCOSE, POC          Collection Time: 03/22/21  6:40 PM         Result  Value  Ref Range            Glucose (POC)  190 (H)  70 - 110 mg/dL           Radiologic Studies -      No orders to display          CT Results  (Last 48 hours)             None                    CXR Results  (Last 48 hours)             None                     Medications given in the ED-     Medications       sodium chloride 0.9 % bolus infusion 1,000 mL (0 mL IntraVENous IV Completed 03/22/21 1813)     gentamicin (GARAMYCIN) 240 mg in 0.9% sodium chloride 100 mL IVPB (0 mg IntraVENous IV Completed 03/22/21 1850)       insulin regular (NOVOLIN R, HUMULIN R) injection 6 Units (6 Units IntraVENous Given 03/22/21 1812)           Please note that this dictation was completed with Dragon, the computer voice recognition software.  Quite often unanticipated grammatical, syntax, homophones, and other interpretive errors are  inadvertently transcribed by  the computer software.  Please disregard these errors.  Please excuse any errors that have escaped final proofreading.

## 2021-03-22 NOTE — ED Notes (Signed)
I have reviewed discharge instructions with the patient.  The patient verbalized understanding.

## 2021-03-22 NOTE — ED Notes (Signed)
Pt arrived via ems with c/o right testicle pain and swelling. Seen in ED x3 days ago for same issue and given ABX but pt says he hasn't really been taking them because he didn't have an appetite to eat and didn't want to take them on a empty stomach.

## 2021-03-23 LAB — T VAGINALIS AMPLIFICATION
T. vaginalis by NAA: NEGATIVE
TRICH VAG BY NAA: NEGATIVE

## 2021-03-28 ENCOUNTER — Inpatient Hospital Stay
Admit: 2021-03-28 | Discharge: 2021-03-29 | Disposition: A | Discharge status: Home / Self Care | Payer: PRIVATE HEALTH INSURANCE | Attending: Emergency Medicine

## 2021-03-28 DIAGNOSIS — E1065 Type 1 diabetes mellitus with hyperglycemia: Secondary | ICD-10-CM

## 2021-03-28 LAB — METABOLIC PANEL, COMPREHENSIVE
A-G Ratio: 0.9 (ref 0.8–1.7)
ALT (SGPT): 21 U/L (ref 16–61)
AST (SGOT): 21 U/L (ref 10–38)
Albumin: 2.3 g/dL — ABNORMAL LOW (ref 3.4–5.0)
Alk. phosphatase: 184 U/L — ABNORMAL HIGH (ref 45–117)
Anion gap: 4 mmol/L (ref 3.0–18)
BUN/Creatinine ratio: 17 (ref 12–20)
BUN: 17 MG/DL (ref 7.0–18)
Bilirubin, total: 0.3 MG/DL (ref 0.2–1.0)
CO2: 26 mmol/L (ref 21–32)
Calcium: 6.8 MG/DL — ABNORMAL LOW (ref 8.5–10.1)
Chloride: 105 mmol/L (ref 100–111)
Creatinine: 0.99 MG/DL (ref 0.6–1.3)
GFR est AA: >60 mL/min/{1.73_m2} (ref >60)
GFR est non-AA: >60 mL/min/{1.73_m2} (ref >60)
Globulin: 2.7 g/dL (ref 2.0–4.0)
Glucose: 611 mg/dL — CR (ref 74–99)
Potassium: 4 mmol/L (ref 3.5–5.5)
Protein, total: 5 g/dL — ABNORMAL LOW (ref 6.4–8.2)
Sodium: 135 mmol/L — ABNORMAL LOW (ref 136–145)

## 2021-03-28 LAB — GLUCOSE, POC
Glucose (POC): >600 mg/dL — CR (ref 70–110)
Glucose (POC): >600 mg/dL — CR (ref 70–110)

## 2021-03-28 LAB — URINALYSIS W/ RFLX MICROSCOPIC
Bilirubin, Urine: NEGATIVE
Bilirubin: NEGATIVE
Blood, Urine: NEGATIVE
Blood: NEGATIVE
Glucose, Ur: >1000 mg/dL — AB
Glucose: >1000 mg/dL — AB
Ketone: NEGATIVE mg/dL
Ketones, Urine: NEGATIVE mg/dL
Leukocyte Esterase, Urine: NEGATIVE
Leukocyte Esterase: NEGATIVE
Nitrite, Urine: NEGATIVE
Nitrites: NEGATIVE
Protein, UA: NEGATIVE mg/dL
Protein: NEGATIVE mg/dL
Specific Gravity, UA: 1.028 (ref 1.005–1.030)
Specific gravity: 1.028 (ref 1.005–1.030)
Urobilinogen, UA, POCT: 0.2 EU/dL (ref 0.2–1.0)
Urobilinogen: 0.2 EU/dL (ref 0.2–1.0)
pH (UA): 7.5 (ref 5.0–8.0)
pH, UA: 7.5 (ref 5.0–8.0)

## 2021-03-28 LAB — DRUG SCREEN, URINE
AMPHETAMINES: NEGATIVE
Amphetamine Screen, Urine: NEGATIVE
BARBITURATES: NEGATIVE
BENZODIAZEPINES: NEGATIVE
Barbiturate Screen, Urine: NEGATIVE
Benzodiazepine Screen, Urine: NEGATIVE
COCAINE: NEGATIVE
Cocaine Screen Urine: NEGATIVE
METHADONE: NEGATIVE
Methadone Screen, Urine: NEGATIVE
OPIATES: NEGATIVE
Opiate Screen, Urine: NEGATIVE
PCP Screen, Urine: NEGATIVE
PCP(PHENCYCLIDINE): NEGATIVE
THC (TH-CANNABINOL): NEGATIVE
THC Screen, Urine: NEGATIVE

## 2021-03-28 LAB — CBC WITH AUTOMATED DIFF
ABS. BASOPHILS: 0.1 10*3/uL (ref 0.0–0.1)
ABS. EOSINOPHILS: 0.1 10*3/uL (ref 0.0–0.4)
ABS. IMM. GRANS.: 0 10*3/uL (ref 0.00–0.04)
ABS. LYMPHOCYTES: 2 10*3/uL (ref 0.9–3.6)
ABS. MONOCYTES: 0.5 10*3/uL (ref 0.05–1.2)
ABS. NEUTROPHILS: 2.3 10*3/uL (ref 1.8–8.0)
ABSOLUTE NRBC: 0 10*3/uL (ref 0.00–0.01)
BASOPHILS: 1 % (ref 0–2)
EOSINOPHILS: 1 % (ref 0–5)
HCT: 37.9 % (ref 36.0–48.0)
HGB: 11.7 g/dL — ABNORMAL LOW (ref 13.0–16.0)
IMMATURE GRANULOCYTES: 0 % (ref 0.0–0.5)
LYMPHOCYTES: 41 % (ref 21–52)
MCH: 26.2 PG (ref 24.0–34.0)
MCHC: 30.9 g/dL — ABNORMAL LOW (ref 31.0–37.0)
MCV: 84.8 FL (ref 78.0–100.0)
MONOCYTES: 9 % (ref 3–10)
MPV: 10.6 FL (ref 9.2–11.8)
NEUTROPHILS: 47 % (ref 40–73)
NRBC: 0 PER 100 WBC
PLATELET: 297 10*3/uL (ref 135–420)
RBC: 4.47 M/uL (ref 4.35–5.65)
RDW: 13.1 % (ref 11.6–14.5)
WBC: 4.9 10*3/uL (ref 4.6–13.2)

## 2021-03-28 LAB — COMPREHENSIVE METABOLIC PANEL
ALT: 21 U/L (ref 16–61)
AST: 21 U/L (ref 10–38)
Albumin/Globulin Ratio: 0.9 (ref 0.8–1.7)
Albumin: 2.3 g/dL — ABNORMAL LOW (ref 3.4–5.0)
Alkaline Phosphatase: 184 U/L — ABNORMAL HIGH (ref 45–117)
Anion Gap: 4 mmol/L (ref 3.0–18)
BUN: 17 MG/DL (ref 7.0–18)
Bun/Cre Ratio: 17 (ref 12–20)
CO2: 26 mmol/L (ref 21–32)
Calcium: 6.8 MG/DL — ABNORMAL LOW (ref 8.5–10.1)
Chloride: 105 mmol/L (ref 100–111)
Creatinine: 0.99 MG/DL (ref 0.6–1.3)
EGFR IF NonAfrican American: >60 mL/min/{1.73_m2} (ref >60)
GFR African American: >60 mL/min/{1.73_m2} (ref >60)
Globulin: 2.7 g/dL (ref 2.0–4.0)
Glucose: 611 mg/dL (ref 74–99)
Potassium: 4 mmol/L (ref 3.5–5.5)
Sodium: 135 mmol/L — ABNORMAL LOW (ref 136–145)
Total Bilirubin: 0.3 MG/DL (ref 0.2–1.0)
Total Protein: 5 g/dL — ABNORMAL LOW (ref 6.4–8.2)

## 2021-03-28 LAB — CBC WITH AUTO DIFFERENTIAL
Basophils %: 1 % (ref 0–2)
Basophils Absolute: 0.1 10*3/uL (ref 0.0–0.1)
Eosinophils %: 1 % (ref 0–5)
Eosinophils Absolute: 0.1 10*3/uL (ref 0.0–0.4)
Granulocyte Absolute Count: 0 10*3/uL (ref 0.00–0.04)
Hematocrit: 37.9 % (ref 36.0–48.0)
Hemoglobin: 11.7 g/dL — ABNORMAL LOW (ref 13.0–16.0)
Immature Granulocytes: 0 % (ref 0.0–0.5)
Lymphocytes %: 41 % (ref 21–52)
Lymphocytes Absolute: 2 10*3/uL (ref 0.9–3.6)
MCH: 26.2 PG (ref 24.0–34.0)
MCHC: 30.9 g/dL — ABNORMAL LOW (ref 31.0–37.0)
MCV: 84.8 FL (ref 78.0–100.0)
MPV: 10.6 FL (ref 9.2–11.8)
Monocytes %: 9 % (ref 3–10)
Monocytes Absolute: 0.5 10*3/uL (ref 0.05–1.2)
NRBC Absolute: 0 10*3/uL (ref 0.00–0.01)
Neutrophils %: 47 % (ref 40–73)
Neutrophils Absolute: 2.3 10*3/uL (ref 1.8–8.0)
Nucleated RBCs: 0 PER 100 WBC
Platelets: 297 10*3/uL (ref 135–420)
RBC: 4.47 M/uL (ref 4.35–5.65)
RDW: 13.1 % (ref 11.6–14.5)
WBC: 4.9 10*3/uL (ref 4.6–13.2)

## 2021-03-28 LAB — POCT GLUCOSE
POC Glucose: >600 mg/dL (ref 70–110)
POC Glucose: >600 mg/dL (ref 70–110)

## 2021-03-28 MED ORDER — INSULIN REGULAR HUMAN 100 UNIT/ML INJECTION
100 unit/mL | INTRAMUSCULAR | Status: AC
Start: 2021-03-28 — End: 2021-03-28
  Administered 2021-03-28: 23:00:00 via INTRAVENOUS

## 2021-03-28 MED ORDER — SODIUM CHLORIDE 0.9% BOLUS IV
0.9 % | Freq: Once | INTRAVENOUS | Status: AC
Start: 2021-03-28 — End: 2021-03-28
  Administered 2021-03-28: 23:00:00 via INTRAVENOUS

## 2021-03-28 MED ORDER — INSULIN REGULAR HUMAN 100 UNIT/ML INJECTION
100 unit/mL | INTRAMUSCULAR | Status: DC
Start: 2021-03-28 — End: 2021-03-28

## 2021-03-28 MED ORDER — SODIUM CHLORIDE 0.9% BOLUS IV
0.9 % | Freq: Once | INTRAVENOUS | Status: AC
Start: 2021-03-28 — End: 2021-03-28
  Administered 2021-03-28: 22:00:00 via INTRAVENOUS

## 2021-03-28 MED FILL — SODIUM CHLORIDE 0.9 % IV: INTRAVENOUS | Qty: 1000

## 2021-03-28 MED FILL — INSULIN REGULAR HUMAN 100 UNIT/ML INJECTION: 100 unit/mL | INTRAMUSCULAR | Qty: 1

## 2021-03-28 NOTE — ED Notes (Signed)
Report given to Sara RN

## 2021-03-28 NOTE — ED Notes (Signed)
 Patient arrived via EMS from home, c/o blurry vision, headache, muscular tightness.     Hx diabetes 70/30 insulin 6 hours ago    EMS blood glucometer shows hi reading PTA

## 2021-03-28 NOTE — ED Provider Notes (Signed)
EMERGENCY DEPARTMENT HISTORY AND PHYSICAL EXAM    Date: 03/28/2021  Patient Name: [name] [name]    History of Presenting Illness     Chief Complaint   Patient presents with    High Blood Sugar         History Provided By: Patient    Additional History (Context):   5:39 PM  [name] [name] is a 25 y.o. male with PMHX of seasonal allergies, insulin-dependent diabetes, partial lung resection left lobectomy who presents to the emergency department C/O hyperglycemia.  Paramedics were called to patient's house for high blood sugar.  He walked without assistance to the vehicle.  They report his blood sugars being high.  He takes insulin has a history of asthma partial lung surgery however further information is somewhat limited.  He denies any drug use although it is extensive in his past.    Social History  He does smoke cigarettes occasionally in addition to using marijuana and occasional cocaine.  EKG drinks alcohol.    Family History  Positive for high blood pressure and diabetes.    PCP: None    Current Outpatient Medications   Medication Sig Dispense Refill    doxycycline (MONODOX) 100 mg capsule Take 1 Capsule by mouth two (2) times a day for 7 days. 14 Capsule 0    albuterol-ipratropium (DUO-NEB) 2.5 mg-0.5 mg/3 ml nebu 3 mL by Nebulization route every four (4) hours as needed for Wheezing. 120 Nebule 0    albuterol sulfate (PROAIR RESPICLICK) 90 mcg/actuation breath activated inhaler Take 2 Puffs by inhalation every four (4) hours as needed for Wheezing. 1 Each 0    insulin glargine (LANTUS) 100 unit/mL injection 28 Units by SubCUTAneous route nightly. 1 Vial 0       Past History     Past Medical History:  Past Medical History:   Diagnosis Date    Diabetes ([place])     Type 1       Past Surgical History:  Past Surgical History:   Procedure Laterality Date    HX LOBECTOMY      Left lung removal    HX LOBECTOMY         Family History:  No family history on file.    Social History:  Social History     Tobacco Use     Smoking status: Former     Packs/day: 0.50     Types: Cigarettes    Smokeless tobacco: Current   Substance Use Topics    Alcohol use: Yes     Comment: here and there    Drug use: Yes     Types: Marijuana       Allergies:  Allergies   Allergen Reactions    Cephalosporins Unknown (comments)    Gantrisin Unknown (comments)         Review of Systems   Review of Systems   Unable to perform ROS: Other (Patient arrived sleepy and groggy did not answer many questions.)   Eyes:  Positive for visual disturbance.   Endocrine: Positive for polydipsia and polyuria.   Neurological:  Positive for headaches.     Physical Exam     Vitals:    03/28/21 1735 03/28/21 1905 03/28/21 2005 03/28/21 2135   BP: 135/88 134/79 117/71 127/73   Pulse: 77      Resp: '16  16 16   ' Temp: 98.4 ??F (36.9 ??C)      SpO2: 100% 99% 99% 100%   Weight: 61.2 kg (135  lb)      Height: '5\' 7"'  (1.702 m)        Physical Exam  Vitals and nursing note reviewed.   Constitutional:       General: He is not in acute distress.     Appearance: He is well-developed and underweight. He is not diaphoretic.      Comments: Tired but none toxic appearing male.  He wakes and answers questions but then seems to return to sleepy appearance.   HENT:      Head: Normocephalic and atraumatic.   Eyes:      General: No scleral icterus.     Extraocular Movements:      Right eye: Normal extraocular motion.      Left eye: Normal extraocular motion.      Conjunctiva/sclera: Conjunctivae normal.      Pupils: Pupils are equal, round, and reactive to light.   Neck:      Trachea: No tracheal deviation.   Cardiovascular:      Rate and Rhythm: Normal rate and regular rhythm.      Heart sounds: Normal heart sounds.   Pulmonary:      Effort: Pulmonary effort is normal. No respiratory distress.      Breath sounds: Normal breath sounds. No stridor.   Abdominal:      General: Bowel sounds are normal. There is no distension.      Palpations: Abdomen is soft.      Tenderness: There is no abdominal  tenderness. There is no rebound.   Musculoskeletal:         General: No tenderness. Normal range of motion.      Cervical back: Normal range of motion and neck supple.      Comments: Grossly unremarkable without abnormalities   Skin:     General: Skin is warm and dry.      Capillary Refill: Capillary refill takes less than 2 seconds.      Findings: No erythema or rash.   Neurological:      Mental Status: He is alert and oriented to person, place, and time.      GCS: GCS eye subscore is 4. GCS verbal subscore is 5. GCS motor subscore is 6.      Cranial Nerves: No cranial nerve deficit.      Motor: No weakness.   Psychiatric:         Mood and Affect: Affect is flat.         Speech: Speech is delayed.         Behavior: Behavior is slowed.         Thought Content: Thought content normal.         Judgment: Judgment normal.     Diagnostic Study Results     Labs -  Recent Results (from the past 24 hour(s))   GLUCOSE, POC    Collection Time: 03/28/21  5:35 PM   Result Value Ref Range    Glucose (POC) >600 (HH) 70 - 110 mg/dL   GLUCOSE, POC    Collection Time: 03/28/21  5:37 PM   Result Value Ref Range    Glucose (POC) >600 (HH) 70 - 110 mg/dL   URINALYSIS W/ RFLX MICROSCOPIC    Collection Time: 03/28/21  5:58 PM   Result Value Ref Range    Color YELLOW      Appearance CLEAR      Specific gravity 1.028 1.005 - 1.030      pH (UA) 7.5 5.0 -  8.0      Protein Negative NEG mg/dL    Glucose >1,000 (A) NEG mg/dL    Ketone Negative NEG mg/dL    Bilirubin Negative NEG      Blood Negative NEG      Urobilinogen 0.2 0.2 - 1.0 EU/dL    Nitrites Negative NEG      Leukocyte Esterase Negative NEG     DRUG SCREEN, URINE    Collection Time: 03/28/21  5:58 PM   Result Value Ref Range    BENZODIAZEPINES Negative NEG      BARBITURATES Negative NEG      THC (TH-CANNABINOL) Negative NEG      OPIATES Negative NEG      PCP(PHENCYCLIDINE) Negative NEG      COCAINE Negative NEG      AMPHETAMINES Negative NEG      METHADONE Negative NEG      HDSCOM  (NOTE)    CBC WITH AUTOMATED DIFF    Collection Time: 03/28/21  6:13 PM   Result Value Ref Range    WBC 4.9 4.6 - 13.2 K/uL    RBC 4.47 4.35 - 5.65 M/uL    HGB 11.7 (L) 13.0 - 16.0 g/dL    HCT 37.9 36.0 - 48.0 %    MCV 84.8 78.0 - 100.0 FL    MCH 26.2 24.0 - 34.0 PG    MCHC 30.9 (L) 31.0 - 37.0 g/dL    RDW 13.1 11.6 - 14.5 %    PLATELET 297 135 - 420 K/uL    MPV 10.6 9.2 - 11.8 FL    NRBC 0.0 0 PER 100 WBC    ABSOLUTE NRBC 0.00 0.00 - 0.01 K/uL    NEUTROPHILS 47 40 - 73 %    LYMPHOCYTES 41 21 - 52 %    MONOCYTES 9 3 - 10 %    EOSINOPHILS 1 0 - 5 %    BASOPHILS 1 0 - 2 %    IMMATURE GRANULOCYTES 0 0.0 - 0.5 %    ABS. NEUTROPHILS 2.3 1.8 - 8.0 K/UL    ABS. LYMPHOCYTES 2.0 0.9 - 3.6 K/UL    ABS. MONOCYTES 0.5 0.05 - 1.2 K/UL    ABS. EOSINOPHILS 0.1 0.0 - 0.4 K/UL    ABS. BASOPHILS 0.1 0.0 - 0.1 K/UL    ABS. IMM. GRANS. 0.0 0.00 - 0.04 K/UL    DF AUTOMATED     METABOLIC PANEL, COMPREHENSIVE    Collection Time: 03/28/21  6:13 PM   Result Value Ref Range    Sodium 135 (L) 136 - 145 mmol/L    Potassium 4.0 3.5 - 5.5 mmol/L    Chloride 105 100 - 111 mmol/L    CO2 26 21 - 32 mmol/L    Anion gap 4 3.0 - 18 mmol/L    Glucose 611 (HH) 74 - 99 mg/dL    BUN 17 7.0 - 18 MG/DL    Creatinine 0.99 0.6 - 1.3 MG/DL    BUN/Creatinine ratio 17 12 - 20      GFR est AA >60 >60 ml/min/1.108m    GFR est non-AA >60 >60 ml/min/1.785m   Calcium 6.8 (L) 8.5 - 10.1 MG/DL    Bilirubin, total 0.3 0.2 - 1.0 MG/DL    ALT (SGPT) 21 16 - 61 U/L    AST (SGOT) 21 10 - 38 U/L    Alk. phosphatase 184 (H) 45 - 117 U/L    Protein, total 5.0 (L) 6.4 - 8.2 g/dL  Albumin 2.3 (L) 3.4 - 5.0 g/dL    Globulin 2.7 2.0 - 4.0 g/dL    A-G Ratio 0.9 0.8 - 1.7     BLOOD GAS,CHEM 8,VENOUS,POC    Collection Time: 03/28/21  6:14 PM   Result Value Ref Range    pH, venous (POC) 7.32 7.32 - 7.42      pCO2, venous (POC) 52.1 (H) 41 - 51 MMHG    pO2, venous (POC) 20 (L) 25 - 40 mmHg    HCO3, venous (POC) 27.2 23.0 - 28.0 MMOL/L    sO2, venous (POC) 28.4 (L) 65 - 88 %    Base  excess, venous (POC) PENDING mmol/L    Base deficit, venous (POC) PENDING mmol/L    Site BLOOD DARK/?VENOUS      Patient temp. 98.4      Specimen type (POC) BLOOD DARK/?VENOUS      Sodium, POC 135 (L) 136 - 145 MMOL/L    Potassium, POC 4.3 3.5 - 5.5 MMOL/L    Glucose, POC 556 (HH) 74 - 106 MG/DL    Chloride (POC) 102 98 - 109 mmol/L    Creatinine (POC) 0.70 0.6 - 1.3 mg/dL    Calcium, ionized (POC) 1.10 (L) 1.12 - 1.32 mmol/L    Performed by [facility]     CO2, venous (POC) 27 22 - 30 MMOL/L    GFRAA, POC >60 >60 ml/min/1.23m    GFRNA, POC >60 >60 ml/min/1.744m   Anion gap (POC) 7 (L) 10 - 20 mmol/L   GLUCOSE, POC    Collection Time: 03/28/21  7:01 PM   Result Value Ref Range    Glucose (POC) 503 (HH) 70 - 110 mg/dL   GLUCOSE, POC    Collection Time: 03/28/21  7:01 PM   Result Value Ref Range    Glucose (POC) 486 (HH) 70 - 110 mg/dL   GLUCOSE, POC    Collection Time: 03/28/21  8:03 PM   Result Value Ref Range    Glucose (POC) 339 (H) 70 - 110 mg/dL   GLUCOSE, POC    Collection Time: 03/28/21  9:09 PM   Result Value Ref Range    Glucose (POC) 155 (H) 70 - 110 mg/dL        Radiologic Studies -   No orders to display     CT Results  (Last 48 hours)      None          CXR Results  (Last 48 hours)      None            Medications given in the ED-  Medications   sodium chloride 0.9 % bolus infusion 1,000 mL (0 mL IntraVENous IV Completed 03/28/21 1915)   insulin regular (NOVOLIN R, HUMULIN R) injection 10 Units (10 Units IntraVENous Given 03/28/21 1834)   sodium chloride 0.9 % bolus infusion 1,000 mL (0 mL IntraVENous IV Completed 03/28/21 1915)   insulin regular (NOVOLIN R, HUMULIN R) injection 10 Units (10 Units IntraVENous Given 03/28/21 2008)         Medical Decision Making   I am the first provider for this patient.    I reviewed the vital signs, available nursing notes, past medical history, past surgical history, family history and social history.    Vital Signs-Reviewed the patient's vital signs.    Pulse  Oximetry Analysis - 100% on room air    Cardiac Monitor:  Rate: 79 bpm  Rhythm: Sinus rhythm  Records Reviewed: NURSING NOTES AND PREVIOUS MEDICAL RECORDS    Provider Notes (Medical Decision Making):   Patient with questionable compliance with his diabetes arrives with hyperglycemia.  He is not in DKA.  He easily corrected after fluids.  For no evidence of DKA or altered metabolism.  Infection infarction were considered as differentials however these were inconsistent with exam and findings.  After medication and fluids he should be appropriate for discharge outpatient follow-up.    Procedures:  Procedures    ED Course:   5:39 PM : Initial assessment performed. The patients presenting problems have been discussed, and they are in agreement with the care plan formulated and outlined with them.  I have encouraged them to ask questions as they arise throughout their visit.    CRITICAL CARE NOTE:  10:26 PM  I have spent 52 minutes of critical care time involved in lab review, consultations with specialist, family decision-making, and documentation.  During this entire length of time I was immediately available to the patient.    Critical Care:  The reason for providing this level of medical care for this critically ill patient was due a critical illness that impaired one or more vital organ systems such that there was a high probability of imminent or life threatening deterioration in the patients condition. This care involved high complexity decision making to assess, manipulate, and support vital system functions, to treat this degreee vital organ system failure and to prevent further life threatening deterioration of the patient???s condition.      Diagnosis and Disposition       DISCHARGE NOTE:  9:42 PM  Izear Butner's  results have been reviewed with him.  He has been counseled regarding his diagnosis, treatment, and plan.  He verbally conveys understanding and agreement of the signs, symptoms, diagnosis,  treatment and prognosis and additionally agrees to follow up as discussed.  He also agrees with the care-plan and conveys that all of his questions have been answered.  I have also provided discharge instructions for him that include: educational information regarding their diagnosis and treatment, and list of reasons why they would want to return to the ED prior to their follow-up appointment, should his condition change. He has been provided with education for proper emergency department utilization.     CLINICAL IMPRESSION:    1. Type 1 diabetes mellitus with hyperglycemia (HCC)    2. Non-compliance with treatment        PLAN:  1. D/C Home  2.   Discharge Medication List as of 03/28/2021  9:42 PM        3.   Follow-up Information    None       _______________________________    This note was partially transcribed via voice recognition software. Although efforts have been made to catch any discrepancies, it may contain sound alike words, grammatical errors, or nonsensical words.

## 2021-03-29 LAB — GLUCOSE, POC
Glucose (POC): 155 mg/dL — ABNORMAL HIGH (ref 70–110)
Glucose (POC): 339 mg/dL — ABNORMAL HIGH (ref 70–110)
Glucose (POC): 486 mg/dL — CR (ref 70–110)
Glucose (POC): 503 mg/dL — CR (ref 70–110)

## 2021-03-29 LAB — BLOOD GAS,CHEM 8,VENOUS,POC: Anion gap (POC): 7 mmol/L — ABNORMAL LOW (ref 10–20)

## 2021-03-29 LAB — POCT GLUCOSE
POC Glucose: 155 mg/dL — ABNORMAL HIGH (ref 70–110)
POC Glucose: 339 mg/dL — ABNORMAL HIGH (ref 70–110)
POC Glucose: 486 mg/dL (ref 70–110)
POC Glucose: 503 mg/dL (ref 70–110)

## 2021-03-29 MED ORDER — INSULIN REGULAR HUMAN 100 UNIT/ML INJECTION
100 unit/mL | INTRAMUSCULAR | Status: AC
Start: 2021-03-29 — End: 2021-03-28
  Administered 2021-03-29: via INTRAVENOUS

## 2021-03-29 MED FILL — INSULIN REGULAR HUMAN 100 UNIT/ML INJECTION: 100 unit/mL | INTRAMUSCULAR | Qty: 1

## 2021-03-30 LAB — BLOOD GAS,CHEM 8,VENOUS,POC
Anion gap (POC): 7 mmol/L — ABNORMAL LOW (ref 10–20)
Base excess, venous (POC): 0.2 mmol/L
CO2, venous (POC): 27 MMOL/L (ref 22–30)
Calcium, ionized (POC): 1.1 mmol/L — ABNORMAL LOW (ref 1.12–1.32)
Chloride (POC): 102 mmol/L (ref 98–109)
Creatinine (POC): 0.7 mg/dL (ref 0.6–1.3)
GFRAA, POC: >60 mL/min/{1.73_m2} (ref >60)
GFRNA, POC: >60 mL/min/{1.73_m2} (ref >60)
Glucose, POC: 556 MG/DL — CR (ref 74–106)
HCO3, venous (POC): 27.2 MMOL/L (ref 23.0–28.0)
Patient temp.: 98.4
Potassium, POC: 4.3 MMOL/L (ref 3.5–5.5)
Sodium, POC: 135 MMOL/L — ABNORMAL LOW (ref 136–145)
pCO2, venous (POC): 52.1 MMHG — ABNORMAL HIGH (ref 41–51)
pH, venous (POC): 7.32 (ref 7.32–7.42)
pO2, venous (POC): 20 mmHg — ABNORMAL LOW (ref 25–40)
sO2, venous (POC): 28.4 % — ABNORMAL LOW (ref 65–88)

## 2021-03-30 LAB — GLUCOSE, POC: Glucose (POC): 486 mg/dL — CR (ref 70–110)

## 2021-03-30 LAB — POCT GLUCOSE: POC Glucose: 486 mg/dL (ref 70–110)
